# Patient Record
Sex: Male | Born: 1985
Health system: Southern US, Community
[De-identification: ages and names within clinical notes are randomized; demographics above are authoritative.]

## PROBLEM LIST (undated history)

## (undated) DIAGNOSIS — C801 Malignant (primary) neoplasm, unspecified: Secondary | ICD-10-CM

## (undated) HISTORY — PX: KNEE ARTHROSCOPY: SUR90

---
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
## 2015-05-22 ENCOUNTER — Ambulatory Visit
Admission: RE | Admit: 2015-05-22 | Discharge: 2015-05-22 | Disposition: A | Discharge status: Home / Self Care | Payer: No Typology Code available for payment source | Source: Ambulatory Visit | Attending: Occupational Medicine | Admitting: Occupational Medicine

## 2015-05-22 ENCOUNTER — Other Ambulatory Visit: Payer: Self-pay | Admitting: Occupational Medicine

## 2015-05-22 DIAGNOSIS — Z021 Encounter for pre-employment examination: Secondary | ICD-10-CM

## 2015-05-29 ENCOUNTER — Other Ambulatory Visit: Payer: Self-pay | Admitting: Occupational Medicine

## 2016-05-18 ENCOUNTER — Ambulatory Visit (HOSPITAL_COMMUNITY)
Admission: EM | Admit: 2016-05-18 | Discharge: 2016-05-18 | Disposition: A | Discharge status: Home / Self Care | Payer: Commercial Managed Care - HMO | Attending: Family Medicine | Admitting: Family Medicine

## 2016-05-18 ENCOUNTER — Encounter (HOSPITAL_COMMUNITY): Payer: Self-pay | Admitting: Emergency Medicine

## 2016-05-18 DIAGNOSIS — R591 Generalized enlarged lymph nodes: Secondary | ICD-10-CM | POA: Diagnosis not present

## 2016-05-18 DIAGNOSIS — R599 Enlarged lymph nodes, unspecified: Secondary | ICD-10-CM

## 2016-05-18 DIAGNOSIS — Z9889 Other specified postprocedural states: Secondary | ICD-10-CM | POA: Insufficient documentation

## 2016-05-18 LAB — COMPREHENSIVE METABOLIC PANEL
ALK PHOS: 71 U/L (ref 38–126)
ALT: 16 U/L — ABNORMAL LOW (ref 17–63)
ANION GAP: 4 — AB (ref 5–15)
AST: 16 U/L (ref 15–41)
Albumin: 3.2 g/dL — ABNORMAL LOW (ref 3.5–5.0)
BILIRUBIN TOTAL: 0.2 mg/dL — AB (ref 0.3–1.2)
BUN: 14 mg/dL (ref 6–20)
CALCIUM: 8.9 mg/dL (ref 8.9–10.3)
CO2: 26 mmol/L (ref 22–32)
Chloride: 107 mmol/L (ref 101–111)
Creatinine, Ser: 1.04 mg/dL (ref 0.61–1.24)
GFR calc Af Amer: >60 mL/min (ref >60)
GLUCOSE: 89 mg/dL (ref 65–99)
Potassium: 4.1 mmol/L (ref 3.5–5.1)
Sodium: 137 mmol/L (ref 135–145)
TOTAL PROTEIN: 7.6 g/dL (ref 6.5–8.1)

## 2016-05-18 LAB — CBC WITH DIFFERENTIAL/PLATELET
Basophils Absolute: 0 10*3/uL (ref 0.0–0.1)
Basophils Relative: 0 %
EOS PCT: 8 %
Eosinophils Absolute: 1 10*3/uL — ABNORMAL HIGH (ref 0.0–0.7)
HCT: 36.3 % — ABNORMAL LOW (ref 39.0–52.0)
Hemoglobin: 11.5 g/dL — ABNORMAL LOW (ref 13.0–17.0)
LYMPHS ABS: 1.5 10*3/uL (ref 0.7–4.0)
LYMPHS PCT: 13 %
MCH: 25.4 pg — AB (ref 26.0–34.0)
MCHC: 31.7 g/dL (ref 30.0–36.0)
MCV: 80.3 fL (ref 78.0–100.0)
MONO ABS: 0.6 10*3/uL (ref 0.1–1.0)
MONOS PCT: 5 %
Neutro Abs: 8.6 10*3/uL — ABNORMAL HIGH (ref 1.7–7.7)
Neutrophils Relative %: 74 %
PLATELETS: 279 10*3/uL (ref 150–400)
RBC: 4.52 MIL/uL (ref 4.22–5.81)
RDW: 15.8 % — AB (ref 11.5–15.5)
WBC: 11.7 10*3/uL — ABNORMAL HIGH (ref 4.0–10.5)

## 2016-05-18 LAB — LACTATE DEHYDROGENASE: LDH: 156 U/L (ref 98–192)

## 2016-05-18 LAB — SEDIMENTATION RATE: Sed Rate: 40 mm/hr — ABNORMAL HIGH (ref 0–16)

## 2016-05-18 NOTE — Discharge Instructions (Signed)

## 2016-05-18 NOTE — ED Provider Notes (Signed)
CSN: [id number]     Arrival date & time 05/18/16  1001 History   First MD Initiated Contact with Patient 05/18/16 1144     Chief Complaint  Patient presents with  . Lymphadenopathy   (Consider location/radiation/quality/duration/timing/severity/associated sxs/prior Treatment) HPI History obtained from patient:  Pt presents with the cc of:  Swollen nodes in neck2 Duration of symptoms: Neck nodes have been present since November left axillary nodes over the past month and groin nodes just recently noticed. Treatment prior to arrival: No treatment Context: Patient states that [place] [employer] when he noticed nodes in his left neck initially small and then have increase in size over the past few months and has not multiplied in the number of them. He denies any weight loss and fatigue any night sweats. He states that he has not had any problems doing his job. Other symptoms include: None Pain score: None FAMILY HISTORY: Hypertension    History reviewed. No pertinent past medical history. Past Surgical History  Procedure Laterality Date  . Knee arthroscopy     No family history on file. Social History  Substance Use Topics  . Smoking status: Never Smoker   . Smokeless tobacco: None  . Alcohol Use: No    Review of Systems  Denies: HEADACHE, NAUSEA, ABDOMINAL PAIN, CHEST PAIN, CONGESTION, DYSURIA, SHORTNESS OF BREATH  Allergies  Review of patient's allergies indicates no known allergies.  Home Medications   Prior to Admission medications   Not on File   Meds Ordered and Administered this Visit  Medications - No data to display  BP 124/68 mmHg  Pulse 55  Temp(Src) 98.1 F (36.7 C) (Oral)  Resp 16  SpO2 98% No data found.   Physical Exam NURSES NOTES AND VITAL SIGNS REVIEWED. CONSTITUTIONAL: Well developed, well nourished, no acute distress HEENT: normocephalic, atraumatic EYES: Conjunctiva normal NECK:normal ROM, supple, nodes noted in the cervical chain  left side 4-5 nodes 1 cm fixed not mobile.  PULMONARY:No respiratory distress, normal effort ABDOMINAL: Soft, ND, NT BS+, No CVAT MUSCULOSKELETAL: Normal ROM of all extremities, single node left axilla and several pea size nodes in left groin.  SKIN: warm and dry without rash PSYCHIATRIC: Mood and affect, behavior are normal  ED Course  Procedures (including critical care time)  Labs Review Labs Reviewed  CBC WITH DIFFERENTIAL/PLATELET - Abnormal; Notable for the following:    WBC 11.7 (*)    Hemoglobin 11.5 (*)    HCT 36.3 (*)    MCH 25.4 (*)    RDW 15.8 (*)    Neutro Abs 8.6 (*)    Eosinophils Absolute 1.0 (*)    All other components within normal limits  SEDIMENTATION RATE - Abnormal; Notable for the following:    Sed Rate 40 (*)    All other components within normal limits  COMPREHENSIVE METABOLIC PANEL - Abnormal; Notable for the following:    Albumin 3.2 (*)    ALT 16 (*)    Total Bilirubin 0.2 (*)    Anion gap 4 (*)    All other components within normal limits  LACTATE DEHYDROGENASE    Imaging Review No results found.   Visual Acuity Review  Right Eye Distance:   Left Eye Distance:   Bilateral Distance:    Right Eye Near:   Left Eye Near:    Bilateral Near:      Patient is referred to Dr. Barkley Bruns for consideration of biopsy of these lymph nodes. Discussed case with Dr. Benay Spice  from oncology he also suggested obtaining a CBC, sedimentation rate, LDH, total chemistry. He is happy to see the patient after surgical consultation has been done. Appointment for patient has been set up and surgical service will see him tomorrow in the office.  Total Visit Time:45 MINUTES "GREATER THAN 50% WAS SPENT IN COUNSELING AND COORDINATION OF CARE WITH THE PATIENT"    MDM   1. Enlarged lymph nodes, unspecified     Patient is reassured that there are no issues that require transfer to higher level of care at this time or additional tests. Patient is advised to  continue home symptomatic treatment. Patient is advised that if there are new or worsening symptoms to attend the emergency department, contact primary care provider, or return to UC. Instructions of care provided discharged home in stable condition.    THIS NOTE WAS GENERATED USING A VOICE RECOGNITION SOFTWARE PROGRAM. ALL REASONABLE EFFORTS  WERE MADE TO PROOFREAD THIS DOCUMENT FOR ACCURACY.  I have verbally reviewed the discharge instructions with the patient. A printed AVS was given to the patient.  All questions were answered prior to discharge.      Craig Schmitt, [place] 05/18/16 1729

## 2016-05-18 NOTE — ED Notes (Signed)
PT has a swollen area over left side of neck that has been present since November. Area is not tender. PT also has a swollen area under his left arm that he reports has been there for 2 weeks. This one is intermittently painful. PT reports since it appeared he has had swelling in his left arm.

## 2016-05-18 NOTE — ED Notes (Signed)
PT discharged by Linde Gillis, PA

## 2016-05-19 ENCOUNTER — Other Ambulatory Visit: Payer: Self-pay | Admitting: General Surgery

## 2016-05-19 ENCOUNTER — Ambulatory Visit: Payer: Self-pay | Admitting: General Surgery

## 2016-05-19 DIAGNOSIS — R590 Localized enlarged lymph nodes: Secondary | ICD-10-CM

## 2016-05-19 NOTE — H&P (Signed)
Craig Schmitt 05/19/2016 9:23 AM Location: [facility] Patient #: [id number] DOB: May 12, 1986 Single / Language: Craig Schmitt / Race: Black or African American Male  History of Present Illness Craig Hollingshead MD; 05/19/2016 10:19 AM) The patient is a 30 year old male.   Note:He is referred by Linde Gillis, PA because of cervical and axillary lymphadenopathy. He first noticed enlargement of left cervical lymph nodes November 2016. Lymph nodes have been greater than progressively getting larger. He then noticed an enlarged lymph node in his left axillary area. These are mildly tender and he's been taking ibuprofen for this. No weight loss. No night sweats. No family history of lymphoma or other malignancy and he is aware of. No dysphasia. No change in appetite. He presented to urgent care for evaluation and was sent over here for consultation. His CBC there was notable for a hemoglobin of 11.5, white cell count 11,700, sedimentation rate of 40, normal LDH, and albumin of 3.2. 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  Other Problems Craig Schmitt, CMA; 05/19/2016 9:24 AM) No pertinent past medical history  Past Surgical History Craig Schmitt, CMA; 05/19/2016 9:24 AM) No pertinent past surgical history  Diagnostic Studies History Craig Schmitt, CMA; 05/19/2016 9:24 AM) Colonoscopy never  Allergies (Craig Schmitt, CMA; 05/19/2016 9:25 AM) No Known Drug Allergies 05/19/2016  Medication History (Craig Schmitt, CMA; 05/19/2016 9:25 AM) No Current Medications Medications Reconciled  Social History Craig Schmitt, CMA; 05/19/2016 9:24 AM) Alcohol use Remotely quit alcohol use. No caffeine use No drug use Tobacco use Never smoker.  Family History Craig Schmitt, CMA; 05/19/2016 9:24 AM) Hypertension Brother, Sister.     Review of Systems Craig Schmitt CMA; 05/19/2016 9:24 AM) General Not Present- Appetite Loss, Chills, Fatigue, Fever, Night Sweats, Weight Gain and  Weight Loss. Skin Not Present- Change in Wart/Mole, Dryness, Hives, Jaundice, New Lesions, Non-Healing Wounds, Rash and Ulcer. HEENT Present- Visual Disturbances. Not Present- Earache, Hearing Loss, Hoarseness, Nose Bleed, Oral Ulcers, Ringing in the Ears, Seasonal Allergies, Sinus Pain, Sore Throat, Wears glasses/contact lenses and Yellow Eyes. Respiratory Not Present- Bloody sputum, Chronic Cough, Difficulty Breathing, Snoring and Wheezing. Cardiovascular Not Present- Chest Pain, Difficulty Breathing Lying Down, Leg Cramps, Palpitations, Rapid Heart Rate, Shortness of Breath and Swelling of Extremities. Gastrointestinal Not Present- Abdominal Pain, Bloating, Bloody Stool, Change in Bowel Habits, Chronic diarrhea, Constipation, Difficulty Swallowing, Excessive gas, Gets full quickly at meals, Hemorrhoids, Indigestion, Nausea, Rectal Pain and Vomiting. Male Genitourinary Not Present- Blood in Urine, Change in Urinary Stream, Frequency, Impotence, Nocturia, Painful Urination, Urgency and Urine Leakage. Musculoskeletal Present- Swelling of Extremities. Not Present- Back Pain, Joint Pain, Joint Stiffness, Muscle Pain and Muscle Weakness. Neurological Not Present- Decreased Memory, Fainting, Headaches, Numbness, Seizures, Tingling, Tremor, Trouble walking and Weakness. Psychiatric Not Present- Anxiety, Bipolar, Change in Sleep Pattern, Depression, Fearful and Frequent crying. Endocrine Not Present- Cold Intolerance, Excessive Hunger, Hair Changes, Heat Intolerance, Hot flashes and New Diabetes. Hematology Not Present- Blood Thinners, Easy Bruising, Excessive bleeding, Gland problems, HIV and Persistent Infections.  Vitals (Craig Schmitt CMA; 05/19/2016 9:24 AM) 05/19/2016 9:24 AM Weight: 268 lb Height: 73in Body Surface Area: 2.44 m Body Mass Index: 35.36 kg/m  Temp.: 53F(Temporal)  Pulse: 77 (Regular)  BP: 128/80 (Sitting, Left Arm, Standard)      Physical Exam Craig Hollingshead  MD; 05/19/2016 10:20 AM)  The physical exam findings are as follows: Note:General: Muscular male in NAD. Pleasant and cooperative.  HEENT: [place]/AT, no external nasal or ear masses, mucous membranes  are moist  EYES: EOMI, no scleral icterus, pupils normal  NECK: Supple, no obvious thyroid mass/enlargement, no trachea deviation  CV: RRR, no murmur, no edema  CHEST: Breath sounds equal and clear. Respirations nonlabored.  ABDOMEN: Soft, nontender, nondistended, no masses, no organomegaly, active bowel sounds, no scars, no hernias.  MUSCULOSKELETAL: FROM, good muscle tone, no edema, no venous stasis changes, normal station and gait  LYMPHATIC: palpable left posterior triangle cervical LNs that are fixed, left , fixed axillary adenopathy.  NEUROLOGIC: Alert and oriented, answers questions appropriately, normal gait and station.  PSYCHIATRIC: Normal mood, affect , and behavior.    Assessment & Plan Craig Hollingshead MD; 05/19/2016 10:16 AM)  Craig Schmitt, CERVICAL (R59.0) Impression: Nodes are firm and fixed in the posterior triangle of the left side. This is concerning for neoplastic process. No weight loss, decreased energy level, or night sweats.  Plan: CT of neck, chest, abdomen, pelvis. Left cervical lymph node biopsy. The procedure, risks, rationale were explained to him. Risks include but not limited to bleeding, infection, wound healing problem, anesthesia, nerve injury. He seems to understand all of this and agrees with the plan.  Craig Confer, MD

## 2016-05-24 ENCOUNTER — Ambulatory Visit
Admission: RE | Admit: 2016-05-24 | Discharge: 2016-05-24 | Disposition: A | Discharge status: Home / Self Care | Payer: Commercial Managed Care - HMO | Source: Ambulatory Visit | Attending: General Surgery | Admitting: General Surgery

## 2016-05-24 ENCOUNTER — Other Ambulatory Visit: Payer: Commercial Managed Care - HMO

## 2016-05-24 DIAGNOSIS — R590 Localized enlarged lymph nodes: Secondary | ICD-10-CM

## 2016-05-24 MED ORDER — IOPAMIDOL (ISOVUE-300) INJECTION 61%
100.0000 mL | Freq: Once | INTRAVENOUS | Status: AC | PRN
  Administered 2016-05-24: 100 mL via INTRAVENOUS

## 2016-05-24 MED ORDER — IOPAMIDOL (ISOVUE-300) INJECTION 61%: 50.0000 mL | Freq: Once | INTRAVENOUS | Status: DC | PRN

## 2016-06-09 ENCOUNTER — Ambulatory Visit: Payer: Self-pay | Admitting: General Surgery

## 2016-07-14 ENCOUNTER — Encounter (HOSPITAL_BASED_OUTPATIENT_CLINIC_OR_DEPARTMENT_OTHER): Payer: Self-pay | Admitting: *Deleted

## 2016-07-20 ENCOUNTER — Ambulatory Visit (HOSPITAL_BASED_OUTPATIENT_CLINIC_OR_DEPARTMENT_OTHER): Payer: Commercial Managed Care - HMO | Admitting: Anesthesiology

## 2016-07-20 ENCOUNTER — Encounter (HOSPITAL_BASED_OUTPATIENT_CLINIC_OR_DEPARTMENT_OTHER)
Admission: RE | Disposition: A | Discharge status: Home / Self Care | Payer: Self-pay | Source: Ambulatory Visit | Attending: General Surgery

## 2016-07-20 ENCOUNTER — Ambulatory Visit (HOSPITAL_BASED_OUTPATIENT_CLINIC_OR_DEPARTMENT_OTHER)
Admission: RE | Admit: 2016-07-20 | Discharge: 2016-07-20 | Disposition: A | Discharge status: Home / Self Care | Payer: Commercial Managed Care - HMO | Source: Ambulatory Visit | Attending: General Surgery | Admitting: General Surgery

## 2016-07-20 DIAGNOSIS — C8191 Hodgkin lymphoma, unspecified, lymph nodes of head, face, and neck: Secondary | ICD-10-CM | POA: Diagnosis not present

## 2016-07-20 DIAGNOSIS — R590 Localized enlarged lymph nodes: Secondary | ICD-10-CM | POA: Diagnosis present

## 2016-07-20 HISTORY — PX: LYMPH GLAND EXCISION: SHX13

## 2016-07-20 SURGERY — EXCISION, LYMPH NODE, CERVICAL
Anesthesia: General | Site: Neck | Laterality: Left

## 2016-07-20 MED ORDER — BUPIVACAINE-EPINEPHRINE 0.5% -1:200000 IJ SOLN
INTRAMUSCULAR | Status: DC | PRN
  Administered 2016-07-20: 3 mL

## 2016-07-20 MED ORDER — CHLORHEXIDINE GLUCONATE CLOTH 2 % EX PADS: 6.0000 | MEDICATED_PAD | Freq: Once | CUTANEOUS | Status: DC

## 2016-07-20 MED ORDER — CEFAZOLIN SODIUM-DEXTROSE 2-4 GM/100ML-% IV SOLN
INTRAVENOUS | Status: AC
  Filled 2016-07-20: qty 100

## 2016-07-20 MED ORDER — CEFAZOLIN SODIUM-DEXTROSE 2-3 GM-% IV SOLR
INTRAVENOUS | Status: DC | PRN
  Administered 2016-07-20: 2 g via INTRAVENOUS

## 2016-07-20 MED ORDER — SUCCINYLCHOLINE CHLORIDE 200 MG/10ML IV SOSY
PREFILLED_SYRINGE | INTRAVENOUS | Status: AC
  Filled 2016-07-20: qty 10

## 2016-07-20 MED ORDER — ONDANSETRON HCL 4 MG/2ML IJ SOLN
INTRAMUSCULAR | Status: DC | PRN
  Administered 2016-07-20: 4 mg via INTRAVENOUS

## 2016-07-20 MED ORDER — PROPOFOL 500 MG/50ML IV EMUL
INTRAVENOUS | Status: AC
  Filled 2016-07-20: qty 50

## 2016-07-20 MED ORDER — BUPIVACAINE HCL (PF) 0.5 % IJ SOLN
INTRAMUSCULAR | Status: AC
  Filled 2016-07-20: qty 30

## 2016-07-20 MED ORDER — MEPERIDINE HCL 25 MG/ML IJ SOLN: 6.2500 mg | INTRAMUSCULAR | Status: DC | PRN

## 2016-07-20 MED ORDER — PROPOFOL 10 MG/ML IV BOLUS
INTRAVENOUS | Status: DC | PRN
  Administered 2016-07-20: 200 mg via INTRAVENOUS

## 2016-07-20 MED ORDER — GLYCOPYRROLATE 0.2 MG/ML IJ SOLN: 0.2000 mg | Freq: Once | INTRAMUSCULAR | Status: DC | PRN

## 2016-07-20 MED ORDER — HYDROMORPHONE HCL 1 MG/ML IJ SOLN: 0.2500 mg | INTRAMUSCULAR | Status: DC | PRN

## 2016-07-20 MED ORDER — FENTANYL CITRATE (PF) 100 MCG/2ML IJ SOLN
INTRAMUSCULAR | Status: DC | PRN
  Administered 2016-07-20: 100 ug via INTRAVENOUS
  Administered 2016-07-20: 50 ug via INTRAVENOUS

## 2016-07-20 MED ORDER — CEFAZOLIN SODIUM-DEXTROSE 2-4 GM/100ML-% IV SOLN: 2.0000 g | INTRAVENOUS | Status: DC

## 2016-07-20 MED ORDER — LIDOCAINE-EPINEPHRINE 1 %-1:100000 IJ SOLN
INTRAMUSCULAR | Status: AC
  Filled 2016-07-20: qty 1

## 2016-07-20 MED ORDER — HYDROCODONE-ACETAMINOPHEN 5-325 MG PO TABS: 1.0000 | ORAL_TABLET | ORAL | 0 refills | Status: DC | PRN

## 2016-07-20 MED ORDER — FENTANYL CITRATE (PF) 100 MCG/2ML IJ SOLN
INTRAMUSCULAR | Status: AC
  Filled 2016-07-20: qty 2

## 2016-07-20 MED ORDER — PROMETHAZINE HCL 25 MG/ML IJ SOLN: 6.2500 mg | INTRAMUSCULAR | Status: DC | PRN

## 2016-07-20 MED ORDER — ONDANSETRON HCL 4 MG/2ML IJ SOLN
INTRAMUSCULAR | Status: AC
  Filled 2016-07-20: qty 2

## 2016-07-20 MED ORDER — SCOPOLAMINE 1 MG/3DAYS TD PT72: 1.0000 | MEDICATED_PATCH | Freq: Once | TRANSDERMAL | Status: DC | PRN

## 2016-07-20 MED ORDER — DEXAMETHASONE SODIUM PHOSPHATE 10 MG/ML IJ SOLN
INTRAMUSCULAR | Status: AC
  Filled 2016-07-20: qty 1

## 2016-07-20 MED ORDER — LIDOCAINE 2% (20 MG/ML) 5 ML SYRINGE
INTRAMUSCULAR | Status: AC
  Filled 2016-07-20: qty 5

## 2016-07-20 MED ORDER — MIDAZOLAM HCL 5 MG/5ML IJ SOLN
INTRAMUSCULAR | Status: DC | PRN
  Administered 2016-07-20: 2 mg via INTRAVENOUS

## 2016-07-20 MED ORDER — LACTATED RINGERS IV SOLN
INTRAVENOUS | Status: DC
  Administered 2016-07-20 (×2): via INTRAVENOUS

## 2016-07-20 MED ORDER — LIDOCAINE HCL (CARDIAC) 20 MG/ML IV SOLN
INTRAVENOUS | Status: DC | PRN
  Administered 2016-07-20: 30 mg via INTRAVENOUS

## 2016-07-20 MED ORDER — LACTATED RINGERS IV SOLN: INTRAVENOUS | Status: DC

## 2016-07-20 MED ORDER — DEXAMETHASONE SODIUM PHOSPHATE 4 MG/ML IJ SOLN
INTRAMUSCULAR | Status: DC | PRN
  Administered 2016-07-20: 10 mg via INTRAVENOUS

## 2016-07-20 MED ORDER — OXYCODONE HCL 5 MG PO TABS: 5.0000 mg | ORAL_TABLET | Freq: Once | ORAL | Status: DC | PRN

## 2016-07-20 MED ORDER — BUPIVACAINE-EPINEPHRINE (PF) 0.5% -1:200000 IJ SOLN
INTRAMUSCULAR | Status: AC
  Filled 2016-07-20: qty 30

## 2016-07-20 MED ORDER — MIDAZOLAM HCL 2 MG/2ML IJ SOLN: 1.0000 mg | INTRAMUSCULAR | Status: DC | PRN

## 2016-07-20 MED ORDER — SODIUM BICARBONATE 4 % IV SOLN
INTRAVENOUS | Status: AC
  Filled 2016-07-20: qty 5

## 2016-07-20 MED ORDER — OXYCODONE HCL 5 MG/5ML PO SOLN: 5.0000 mg | Freq: Once | ORAL | Status: DC | PRN

## 2016-07-20 MED ORDER — MIDAZOLAM HCL 2 MG/2ML IJ SOLN
INTRAMUSCULAR | Status: AC
  Filled 2016-07-20: qty 2

## 2016-07-20 MED ORDER — FENTANYL CITRATE (PF) 100 MCG/2ML IJ SOLN: 50.0000 ug | INTRAMUSCULAR | Status: DC | PRN

## 2016-07-20 SURGICAL SUPPLY — 48 items
BENZOIN TINCTURE PRP APPL 2/3 (GAUZE/BANDAGES/DRESSINGS) ×2 IMPLANT
BLADE CLIPPER SURG (BLADE) IMPLANT
BLADE SURG 10 STRL SS (BLADE) ×2 IMPLANT
BLADE SURG 15 STRL LF DISP TIS (BLADE) ×1 IMPLANT
BLADE SURG 15 STRL SS (BLADE) ×1
CANISTER SUCT 1200ML W/VALVE (MISCELLANEOUS) IMPLANT
CHLORAPREP W/TINT 26ML (MISCELLANEOUS) ×2 IMPLANT
CLEANER CAUTERY TIP 5X5 PAD (MISCELLANEOUS) IMPLANT
COVER BACK TABLE 60X90IN (DRAPES) ×2 IMPLANT
COVER MAYO STAND STRL (DRAPES) ×2 IMPLANT
DECANTER SPIKE VIAL GLASS SM (MISCELLANEOUS) IMPLANT
DRAPE LAPAROTOMY 100X72 PEDS (DRAPES) ×2 IMPLANT
DRSG TEGADERM 2-3/8X2-3/4 SM (GAUZE/BANDAGES/DRESSINGS) ×2 IMPLANT
DRSG TEGADERM 4X4.75 (GAUZE/BANDAGES/DRESSINGS) IMPLANT
ELECT REM PT RETURN 9FT ADLT (ELECTROSURGICAL) ×2
ELECTRODE REM PT RTRN 9FT ADLT (ELECTROSURGICAL) ×1 IMPLANT
GAUZE SPONGE 4X4 16PLY XRAY LF (GAUZE/BANDAGES/DRESSINGS) IMPLANT
GLOVE BIOGEL PI IND STRL 7.0 (GLOVE) ×2 IMPLANT
GLOVE BIOGEL PI IND STRL 8.5 (GLOVE) ×1 IMPLANT
GLOVE BIOGEL PI INDICATOR 7.0 (GLOVE) ×2
GLOVE BIOGEL PI INDICATOR 8.5 (GLOVE) ×1
GLOVE ECLIPSE 6.5 STRL STRAW (GLOVE) ×2 IMPLANT
GLOVE ECLIPSE 8.0 STRL XLNG CF (GLOVE) ×2 IMPLANT
GOWN STRL REUS W/ TWL LRG LVL3 (GOWN DISPOSABLE) ×2 IMPLANT
GOWN STRL REUS W/TWL LRG LVL3 (GOWN DISPOSABLE) ×2
HEMOSTAT SURGICEL .5X2 ABSORB (HEMOSTASIS) ×2 IMPLANT
LIQUID BAND (GAUZE/BANDAGES/DRESSINGS) ×2 IMPLANT
NEEDLE HYPO 25X1 1.5 SAFETY (NEEDLE) ×2 IMPLANT
NS IRRIG 1000ML POUR BTL (IV SOLUTION) ×2 IMPLANT
PACK BASIN DAY SURGERY FS (CUSTOM PROCEDURE TRAY) ×2 IMPLANT
PAD CLEANER CAUTERY TIP 5X5 (MISCELLANEOUS)
PENCIL BUTTON HOLSTER BLD 10FT (ELECTRODE) ×2 IMPLANT
SPONGE GAUZE 2X2 8PLY STRL LF (GAUZE/BANDAGES/DRESSINGS) ×2 IMPLANT
SPONGE GAUZE 4X4 12PLY STER LF (GAUZE/BANDAGES/DRESSINGS) IMPLANT
STRIP CLOSURE SKIN 1/2X4 (GAUZE/BANDAGES/DRESSINGS) ×2 IMPLANT
SUT MON AB 4-0 PC3 18 (SUTURE) ×2 IMPLANT
SUT PROLENE 2 0 CT2 30 (SUTURE) IMPLANT
SUT VIC AB 2-0 SH 27 (SUTURE) ×1
SUT VIC AB 2-0 SH 27XBRD (SUTURE) ×1 IMPLANT
SUT VIC AB 3-0 SH 27 (SUTURE) ×1
SUT VIC AB 3-0 SH 27X BRD (SUTURE) ×1 IMPLANT
SUT VIC AB 4-0 SH 18 (SUTURE) IMPLANT
SUT VIC AB 4-0 SH 27 (SUTURE)
SUT VIC AB 4-0 SH 27XANBCTRL (SUTURE) IMPLANT
SYR CONTROL 10ML LL (SYRINGE) ×2 IMPLANT
TOWEL OR 17X24 6PK STRL BLUE (TOWEL DISPOSABLE) ×4 IMPLANT
TUBE CONNECTING 20X1/4 (TUBING) IMPLANT
YANKAUER SUCT BULB TIP NO VENT (SUCTIONS) IMPLANT

## 2016-07-20 NOTE — Anesthesia Procedure Notes (Signed)
Procedure Name: LMA Insertion Date/Time: 07/20/2016 7:31 AM Performed by: Toula Moos L Pre-anesthesia Checklist: Patient identified, Emergency Drugs available, Suction available, Patient being monitored and Timeout performed Patient Re-evaluated:Patient Re-evaluated prior to inductionOxygen Delivery Method: Circle system utilized Preoxygenation: Pre-oxygenation with 100% oxygen Intubation Type: IV induction Ventilation: Mask ventilation without difficulty LMA: LMA inserted LMA Size: 5.0 Number of attempts: 1 Airway Equipment and Method: Bite block Placement Confirmation: positive ETCO2 Tube secured with: Tape Dental Injury: Teeth and Oropharynx as per pre-operative assessment

## 2016-07-20 NOTE — Anesthesia Postprocedure Evaluation (Signed)
Anesthesia Post Note  Patient: Craig Schmitt  Procedure(s) Performed: Procedure(s) (LRB): LEFT CERVICAL LYMPH NODE BIOPSY (Left)  Patient location during evaluation: PACU Anesthesia Type: General Level of consciousness: awake and alert Pain management: pain level controlled Vital Signs Assessment: post-procedure vital signs reviewed and stable Respiratory status: spontaneous breathing, nonlabored ventilation, respiratory function stable and patient connected to nasal cannula oxygen Cardiovascular status: blood pressure returned to baseline and stable Postop Assessment: no signs of nausea or vomiting Anesthetic complications: no    Last Vitals:  Vitals:   07/20/16 0900 07/20/16 0905  BP: (!) 151/90   Pulse: 69 63  Resp: 18 18  Temp:      Last Pain:  Vitals:   07/20/16 0845  TempSrc:   PainSc: 0-No pain                 Effie Berkshire

## 2016-07-20 NOTE — Discharge Instructions (Addendum)
Light activities for 3-5 days.  Stay upright for next 6 hours.  Apply ice to the area for the next 2-3 days to help with swelling and pain.  Take pain medicine as needed.  Call for heavy bleeding or other wound problems.    Post Anesthesia Home Care Instructions  Activity: Get plenty of rest for the remainder of the day. A responsible adult should stay with you for 24 hours following the procedure.  For the next 24 hours, DO NOT: -Drive a car -Paediatric nurse -Drink alcoholic beverages -Take any medication unless instructed by your physician -Make any legal decisions or sign important papers.  Meals: Start with liquid foods such as gelatin or soup. Progress to regular foods as tolerated. Avoid greasy, spicy, heavy foods. If nausea and/or vomiting occur, drink only clear liquids until the nausea and/or vomiting subsides. Call your physician if vomiting continues.  Special Instructions/Symptoms: Your throat may feel dry or sore from the anesthesia or the breathing tube placed in your throat during surgery. If this causes discomfort, gargle with warm salt water. The discomfort should disappear within 24 hours.  If you had a scopolamine patch placed behind your ear for the management of post- operative nausea and/or vomiting:  1. The medication in the patch is effective for 72 hours, after which it should be removed.  Wrap patch in a tissue and discard in the trash. Wash hands thoroughly with soap and water. 2. You may remove the patch earlier than 72 hours if you experience unpleasant side effects which may include dry mouth, dizziness or visual disturbances. 3. Avoid touching the patch. Wash your hands with soap and water after contact with the patch.

## 2016-07-20 NOTE — Op Note (Signed)
Operative Note  Craig Schmitt male 30 y.o. 07/20/2016  PREOPERATIVE DX:  Left cervical lymphadenopathy  POSTOPERATIVE DX:  Same  PROCEDURE:   Left cervical lymph node biopsy         Surgeon: Odis Hollingshead   Assistants: None  Anesthesia: General LMA anesthesia + Marcaine local  Indications:   This is a 30 year old otherwise healthy male which had progressively increasing left cervical and left axillary adenopathy. CT scan demonstrates a cervical maxillary adenopathy as well as mediastinal adenopathy. He now presents for left cervical lymph node biopsy.    Procedure Detail:  He was seen in the holding area in the left neck marked with my initials. He is brought to the operating room placed supine on the operating table in the anesthetic was administered. The left neck was sterilely prepped and draped. A timeout was performed.  Local anesthetic was infiltrated in the left lateral leg directly over a very large, hard lymph node. A small transverse incision was made through the skin and subcutaneous tissue as well as superficial muscle. The lymph node was exposed. Using electrocautery and the scalpel I removed multiple pieces of the lymph node. Bleeding was controlled with electrocautery and a small piece of Surgicel. The biopsy specimens were sent to pathology.  Once hemostasis was adequate, the muscles reapproximated with interrupted 3-0 Vicryl sutures. The subcutaneous tissues approximated with interrupted 3-0 Vicryl sutures. The skin was closed with a running 4-0 Monocryl subcuticular stitch. Steri-Strips and a sterile dressing were applied.  He tolerated the procedure well without any apparent complications. He was taken to the recovery room in satisfactory condition.  Estimated Blood Loss:  less than 100 mL         Specimens: Multiple pieces of an enlarged left cervical lymph node.        Complications:  * No complications entered in OR log *         Disposition: PACU -  hemodynamically stable.         Condition: stable

## 2016-07-20 NOTE — Transfer of Care (Signed)
Immediate Anesthesia Transfer of Care Note  Patient: Craig Schmitt  Procedure(s) Performed: Procedure(s): LEFT CERVICAL LYMPH NODE BIOPSY (Left)  Patient Location: PACU  Anesthesia Type:General  Level of Consciousness: awake  Airway & Oxygen Therapy: Patient Spontanous Breathing and Patient connected to face mask oxygen  Post-op Assessment: Report given to RN and Post -op Vital signs reviewed and stable  Post vital signs: Reviewed and stable  Last Vitals:  Vitals:   07/20/16 0643  BP: 127/63  Pulse: (!) 55  Resp: 16  Temp: 36.8 C    Last Pain:  Vitals:   07/20/16 0643  TempSrc: Oral         Complications: No apparent anesthesia complications

## 2016-07-20 NOTE — Interval H&P Note (Signed)
History and Physical Interval Note:  07/20/2016 7:22 AM  Craig Schmitt  has presented today for surgery, with the diagnosis of lymphadenopathy  The various methods of treatment have been discussed with the patient and family. After consideration of risks, benefits and other options for treatment, the patient has consented to  Procedure(s): LEFT CERVICAL LYMPH NODE BIOPSY (Left) as a surgical intervention .  The patient's history has been reviewed, patient examined, no change in status, stable for surgery.  I have reviewed the patient's chart and labs.  Questions were answered to the patient's satisfaction.     [name]

## 2016-07-20 NOTE — Anesthesia Preprocedure Evaluation (Addendum)
Anesthesia Evaluation  Patient identified by MRN, date of birth, ID band Patient awake    Reviewed: Allergy & Precautions, NPO status , Patient's Chart, lab work & pertinent test results  Airway Mallampati: I  TM Distance: >3 FB Neck ROM: Full    Dental  (+) Teeth Intact, Dental Advisory Given   Pulmonary neg pulmonary ROS,    breath sounds clear to auscultation       Cardiovascular negative cardio ROS   Rhythm:Regular Rate:Normal     Neuro/Psych negative neurological ROS  negative psych ROS   GI/Hepatic negative GI ROS, Neg liver ROS,   Endo/Other  negative endocrine ROS  Renal/GU negative Renal ROS  negative genitourinary   Musculoskeletal negative musculoskeletal ROS (+)   Abdominal   Peds negative pediatric ROS (+)  Hematology negative hematology ROS (+)   Anesthesia Other Findings   Reproductive/Obstetrics negative OB ROS                            Anesthesia Physical Anesthesia Plan  ASA: I  Anesthesia Plan: General   Post-op Pain Management:    Induction: Intravenous  Airway Management Planned: LMA  Additional Equipment:   Intra-op Plan:   Post-operative Plan: Extubation in OR  Informed Consent: I have reviewed the patients History and Physical, chart, labs and discussed the procedure including the risks, benefits and alternatives for the proposed anesthesia with the patient or authorized representative who has indicated his/her understanding and acceptance.   Dental advisory given  Plan Discussed with: CRNA  Anesthesia Plan Comments:         Anesthesia Quick Evaluation

## 2016-07-20 NOTE — H&P (Signed)
History of Present Illness The patient is a 30 year old male.   Note:He was referred by Linde Gillis, PA because of cervical and axillary lymphadenopathy. He first noticed enlargement of left cervical lymph nodes November 2016. Lymph nodes have been  progressively getting larger. He then noticed an enlarged lymph node in his left axillary area. These are mildly tender and he's been taking ibuprofen for this. No weight loss. No night sweats. No family history of lymphoma or other malignancy and he is aware of. No dysphasia. No change in appetite. He presented to urgent care for evaluation and was sent over to [facility] for consultation. His CBC there was notable for a hemoglobin of 11.5, white cell count 11,700, sedimentation rate of 40, normal LDH, and albumin of 3.2. CTs showed mediastinal adenopathy, left cervical adenopathy, left axillary adenopathy.  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  Other Problems  No pertinent past medical history  Past Surgical History No pertinent past surgical history   Allergies (Sonya Bynum, CMA; 05/19/2016 9:25 AM) No Known Drug Allergies 05/19/2016  Prior to Admission medications   Medication Sig Start Date End Date Taking? Authorizing Provider  ibuprofen (ADVIL,MOTRIN) 200 MG tablet Take 200 mg by mouth every 6 (six) hours as needed for mild pain.   Yes Historical Provider, MD     Social History  Alcohol use Remotely quit alcohol use. No caffeine use No drug use Tobacco use Never smoker.  Family History  Hypertension Brother, Sister.   Physical Exam  The physical exam findings are as follows: Note:General: Muscular male in NAD. Pleasant and cooperative.  HEENT: [place]/AT, no external nasal or ear masses, mucous membranes are moist  EYES: EOMI, no scleral icterus, pupils normal  NECK: Supple, no obvious thyroid mass/enlargement, no trachea deviation  CV: RRR, no murmur, no edema  CHEST: Breath  sounds equal and clear. Respirations nonlabored.  ABDOMEN: Soft, nontender, nondistended, no masses, no organomegaly, active bowel sounds, no scars, no hernias.  MUSCULOSKELETAL: FROM, good muscle tone, no edema, no venous stasis changes, normal station and gait  LYMPHATIC: palpable left posterior triangle cervical LNs that are fixed, left , fixed axillary adenopathy.  NEUROLOGIC: Alert and oriented, answers questions appropriately, normal gait and station.  PSYCHIATRIC: Normal mood, affect , and behavior.    Assessment & Plan   LYMPHADENOPATHY, CERVICAL (R59.0) Impression: Nodes are firm and fixed in the posterior triangle of the left side. This is concerning for neoplastic process. No weight loss, decreased energy level, or night sweats.  Plan: . Left cervical lymph node biopsy. The procedure, risks, rationale were explained to him. Risks include but not limited to bleeding, infection, wound healing problem, anesthesia, nerve injury. He seems to understand all of this and agrees with the plan.  Jackolyn Confer, MD

## 2016-07-21 ENCOUNTER — Encounter (HOSPITAL_BASED_OUTPATIENT_CLINIC_OR_DEPARTMENT_OTHER): Payer: Self-pay | Admitting: General Surgery

## 2016-07-27 ENCOUNTER — Ambulatory Visit (HOSPITAL_BASED_OUTPATIENT_CLINIC_OR_DEPARTMENT_OTHER): Payer: Commercial Managed Care - HMO | Admitting: Hematology

## 2016-07-27 ENCOUNTER — Other Ambulatory Visit (HOSPITAL_BASED_OUTPATIENT_CLINIC_OR_DEPARTMENT_OTHER): Payer: Commercial Managed Care - HMO

## 2016-07-27 ENCOUNTER — Encounter: Payer: Self-pay | Admitting: Hematology

## 2016-07-27 VITALS — BP 145/75 | HR 61 | Temp 98.1°F | Resp 18 | Ht 73.0 in | Wt 264.1 lb

## 2016-07-27 DIAGNOSIS — Z23 Encounter for immunization: Secondary | ICD-10-CM

## 2016-07-27 DIAGNOSIS — C8118 Nodular sclerosis classical Hodgkin lymphoma, lymph nodes of multiple sites: Secondary | ICD-10-CM

## 2016-07-27 DIAGNOSIS — C8111 Nodular sclerosis classical Hodgkin lymphoma, lymph nodes of head, face, and neck: Secondary | ICD-10-CM

## 2016-07-27 DIAGNOSIS — C8190 Hodgkin lymphoma, unspecified, unspecified site: Secondary | ICD-10-CM | POA: Insufficient documentation

## 2016-07-27 LAB — CBC & DIFF AND RETIC
BASO%: 0.5 % (ref 0.0–2.0)
BASOS ABS: 0.1 10*3/uL (ref 0.0–0.1)
EOS%: 4.9 % (ref 0.0–7.0)
Eosinophils Absolute: 0.8 10*3/uL — ABNORMAL HIGH (ref 0.0–0.5)
HEMATOCRIT: 42.9 % (ref 38.4–49.9)
HGB: 13.8 g/dL (ref 13.0–17.1)
IMMATURE RETIC FRACT: 5.4 % (ref 3.00–10.60)
LYMPH#: 1.4 10*3/uL (ref 0.9–3.3)
LYMPH%: 9 % — ABNORMAL LOW (ref 14.0–49.0)
MCH: 26.2 pg — ABNORMAL LOW (ref 27.2–33.4)
MCHC: 32.3 g/dL (ref 32.0–36.0)
MCV: 81.2 fL (ref 79.3–98.0)
MONO#: 1 10*3/uL — AB (ref 0.1–0.9)
MONO%: 6.5 % (ref 0.0–14.0)
NEUT#: 12.3 10*3/uL — ABNORMAL HIGH (ref 1.5–6.5)
NEUT%: 79.1 % — AB (ref 39.0–75.0)
PLATELETS: 259 10*3/uL (ref 140–400)
RBC: 5.28 10*6/uL (ref 4.20–5.82)
RDW: 17.3 % — ABNORMAL HIGH (ref 11.0–14.6)
RETIC CT ABS: 42.77 10*3/uL (ref 34.80–93.90)
Retic %: 0.81 % (ref 0.80–1.80)
WBC: 15.5 10*3/uL — ABNORMAL HIGH (ref 4.0–10.3)

## 2016-07-27 LAB — COMPREHENSIVE METABOLIC PANEL
ALT: 18 U/L (ref 0–55)
ANION GAP: 11 meq/L (ref 3–11)
AST: 16 U/L (ref 5–34)
Albumin: 3.6 g/dL (ref 3.5–5.0)
Alkaline Phosphatase: 96 U/L (ref 40–150)
BILIRUBIN TOTAL: 0.35 mg/dL (ref 0.20–1.20)
BUN: 15.2 mg/dL (ref 7.0–26.0)
CO2: 24 meq/L (ref 22–29)
CREATININE: 1.1 mg/dL (ref 0.7–1.3)
Calcium: 10 mg/dL (ref 8.4–10.4)
Chloride: 106 mEq/L (ref 98–109)
EGFR: >90 mL/min/{1.73_m2} (ref >90)
Glucose: 84 mg/dl (ref 70–140)
Potassium: 4.2 mEq/L (ref 3.5–5.1)
Sodium: 140 mEq/L (ref 136–145)
TOTAL PROTEIN: 8.6 g/dL — AB (ref 6.4–8.3)

## 2016-07-27 LAB — LACTATE DEHYDROGENASE: LDH: 157 U/L (ref 125–245)

## 2016-07-27 MED ORDER — INFLUENZA VAC SPLIT QUAD 0.5 ML IM SUSY
0.5000 mL | PREFILLED_SYRINGE | Freq: Once | INTRAMUSCULAR | Status: AC
  Administered 2016-07-27: 0.5 mL via INTRAMUSCULAR
  Filled 2016-07-27: qty 0.5

## 2016-07-27 NOTE — Progress Notes (Signed)
Per Dr. Irene Limbo, research department notified that patient is candidate for lymphoma trial.  Remer Macho aware, will pass info along to research RN.  Pt made aware that research RN will be contacting him with more information.

## 2016-07-28 ENCOUNTER — Other Ambulatory Visit: Payer: Self-pay | Admitting: Radiology

## 2016-07-28 LAB — HEPATITIS B SURFACE ANTIGEN: HBsAg Screen: NEGATIVE

## 2016-07-28 LAB — SEDIMENTATION RATE: Sedimentation Rate-Westergren: 43 mm/hr — ABNORMAL HIGH (ref 0–15)

## 2016-07-28 LAB — HEPATITIS C ANTIBODY

## 2016-07-29 ENCOUNTER — Encounter: Payer: Self-pay | Admitting: *Deleted

## 2016-07-29 ENCOUNTER — Other Ambulatory Visit: Payer: Self-pay | Admitting: General Surgery

## 2016-07-29 ENCOUNTER — Other Ambulatory Visit: Payer: Self-pay | Admitting: Hematology

## 2016-07-29 ENCOUNTER — Other Ambulatory Visit: Payer: Self-pay | Admitting: *Deleted

## 2016-07-29 ENCOUNTER — Other Ambulatory Visit: Payer: Commercial Managed Care - HMO

## 2016-07-29 DIAGNOSIS — C8118 Nodular sclerosis classical Hodgkin lymphoma, lymph nodes of multiple sites: Secondary | ICD-10-CM

## 2016-07-29 MED ORDER — DEXAMETHASONE 4 MG PO TABS: ORAL_TABLET | ORAL | 1 refills | Status: DC

## 2016-07-29 MED ORDER — ONDANSETRON HCL 8 MG PO TABS: 8.0000 mg | ORAL_TABLET | Freq: Two times a day (BID) | ORAL | 1 refills | Status: DC | PRN

## 2016-07-29 MED ORDER — LIDOCAINE-PRILOCAINE 2.5-2.5 % EX CREA: TOPICAL_CREAM | CUTANEOUS | 3 refills | Status: DC

## 2016-07-29 MED ORDER — PROCHLORPERAZINE MALEATE 10 MG PO TABS: 10.0000 mg | ORAL_TABLET | Freq: Four times a day (QID) | ORAL | 1 refills | Status: DC | PRN

## 2016-07-29 NOTE — Progress Notes (Signed)
START ON PATHWAY REGIMEN - Lymphoma and CLL  LYOS296: ABVD q28 Days x 4 Cycles Followed by 30 Gy IFRT   A cycle is every 28 days:     Doxorubicin (Adriamycin(R)) 25 mg/m2 IV Push days 1 and 15 Dose Mod: None     Dacarbazine 375 mg/m2 in 250 mL D5W IV over 60 minutes days 1 and 15 Dose Mod: None     Vinblastine (Velban(R)) 6 mg/m2 in 50 ml NS IV over 10 minutes days 1 and 15 Dose Mod: None     Bleomycin (Blenoxane(R)) 10 mg/m2 in 50 mL NS IV over 20 minutes days 1 and 15 **SEE ADDITIONAL ORDER** Dose Mod: None Additional Orders: Bleomycin 1mg  subcut as test dose prior to first dose only.  Wait 1 hour prior to administration of subsequent dose if there is no anaphylactoid reaction. Schedule Day 15 chemotherapy. Bleomycin: 1 unit = 1 mg.  PFTs at baseline and as clinically indicated.  **Always confirm dose/schedule in your pharmacy ordering system**    Patient Characteristics: Classic Hodgkin Lymphoma, First Line, Stage I / II, Early Unfavorable with  Risk Factors Other  Than Bulky Mediastinal Disease, Age < 25 Disease Type: Classic Hodgkin Lymphoma Disease Type: Not Applicable Line of therapy: First Line [place] Stage: IIA First Line, Stage I/II Disease Characteristics: Early Unfavorable with Risk Factors Other Than Bulky Mediastinal Disease Age: < 6  Intent of Therapy: Curative Intent, Discussed with Patient

## 2016-07-29 NOTE — Progress Notes (Signed)
[name]    HEMATOLOGY/ONCOLOGY CONSULTATION NOTE  Date of Service: 07/29/2016  Patient Care Team: No Pcp Per Patient as PCP - General (General Practice)  CHIEF COMPLAINTS/PURPOSE OF CONSULTATION:  Newly diagnosed classical [name]   HISTORY OF PRESENTING ILLNESS:   Craig Schmitt is a wonderful 30 y.o. male who has been referred to [place] by Dr .Jackolyn Confer MD for evaluation and management of newly diagnosed classical [name].  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 The lymph nodes in his neck were progressively growing and he noted some enlarged lymph nodes under his left armpit  and therefore sought additional attention.   He had a CT of the neck  on 05/24/2016 that showed multiple enlarged lymph nodes in the left neck concerning for possible lymphoma. No pharyngeal masses were noted. CT of the chest/Abd done the same day showed left posterior cervical, supraclavicular, left axillary, mediastinal and right hilar lymphadenopathy highly suspicious for lymphoma. No findings suspicious for lymphoma beneath the diaphragm. Spleen was normal in size.  Patient was seen by Dr. Jackolyn Confer had a left cervical lymph node biopsy on 07/20/2016 that showed classical [name], nodular sclerosis type.  Patient reports no fevers no chills no night sweats no weight loss no fatigue. No change in his breathing. No chest pain. No abdominal pain or discomfort. No change in bowel habits. No headaches or focal neurological deficits.  He is here for his clinic visit with his brother for discussion of his diagnosis. He was not aware of the final pathologic diagnosis till we discussed it in clinic today.   MEDICAL HISTORY:  1)Hives to pollen  SURGICAL HISTORY: Past Surgical History:  Procedure Laterality Date  .  KNEE ARTHROSCOPY    . LYMPH GLAND EXCISION Left 07/20/2016   Procedure: LEFT CERVICAL LYMPH NODE BIOPSY;  Surgeon: Jackolyn Confer, MD;  Location: [facility];  Service: General;  Laterality: Left;    SOCIAL HISTORY: Social History   Social History  . Marital status: Unknown    Spouse name: N/A  . Number of children: N/A  . Years of education: N/A   Occupational History  . Not on file.   Social History Main Topics  . Smoking status: Never Smoker  . Smokeless tobacco: Never Used  . Alcohol use No  . Drug use: No  . Sexual activity: Not on file   Other Topics Concern  . Not on file   Social History Narrative  . No narrative on file  Never smoker   no significant alcohol use  FAMILY HISTORY: family history of hypertension  No known family history of blood disorders or cancer .  ALLERGIES:  has No Known Allergies.  Allergic to pollen no other known drug or food allergies .  MEDICATIONS:  Current Outpatient Prescriptions  Medication Sig Dispense Refill  . ibuprofen (ADVIL,MOTRIN) 200 MG tablet Take 200 mg by mouth every 6 (six) hours as needed for mild pain.     No current facility-administered medications for this visit.     REVIEW OF SYSTEMS:    10 Point review of Systems was done is negative except as noted above.  PHYSICAL EXAMINATION: ECOG PERFORMANCE STATUS: 0 - Asymptomatic  . Vitals:   07/27/16 0841  BP: (!) 145/75  Pulse: 61  Resp: 18  Temp: 98.1 F (36.7 C)   Filed  Weights   07/27/16 0841  Weight: 264 lb 1.6 oz (119.8 kg)   .Body mass index is 34.84 kg/m.  GENERAL:alert, well-built African-American gentleman in no acute distress and comfortable SKIN: skin color, texture, turgor are normal, no rashes or significant lesions EYES: normal, conjunctiva are pink and non-injected, sclera clear OROPHARYNX:no exudate, no erythema and lips, buccal mucosa, and tongue normal  NECK: supple, no JVD, thyroid normal size, non-tender, without  nodularity LYMPH:  Palpable left cervical , left supraclavicular and left axillary lymph nodes .no palpable inguinal lymph nodes.  LUNGS: clear to auscultation with normal respiratory effort HEART: regular rate & rhythm,  no murmurs and no lower extremity edema ABDOMEN: abdomen soft, non-tender, normoactive bowel sounds , no palpable hepatosplenomegaly . Musculoskeletal: no cyanosis of digits and no clubbing  PSYCH: alert & oriented x 3 with fluent speech NEURO: no focal motor/sensory deficits  LABORATORY DATA:  I have reviewed the data as listed  . CBC Latest Ref Rng & Units 07/27/2016 05/18/2016  WBC 4.0 - 10.3 10e3/uL 15.5(H) 11.7(H)  Hemoglobin 13.0 - 17.1 g/dL 13.8 11.5(L)  Hematocrit 38.4 - 49.9 % 42.9 36.3(L)  Platelets 140 - 400 10e3/uL 259 279    . CMP Latest Ref Rng & Units 07/27/2016 05/18/2016  Glucose 70 - 140 mg/dl 84 89  BUN 7.0 - 26.0 mg/dL 15.2 14  Creatinine 0.7 - 1.3 mg/dL 1.1 1.04  Sodium 136 - 145 mEq/L 140 137  Potassium 3.5 - 5.1 mEq/L 4.2 4.1  Chloride 101 - 111 mmol/L - 107  CO2 22 - 29 mEq/L 24 26  Calcium 8.4 - 10.4 mg/dL 10.0 8.9  Total Protein 6.4 - 8.3 g/dL 8.6(H) 7.6  Total Bilirubin 0.20 - 1.20 mg/dL 0.35 0.2(L)  Alkaline Phos 40 - 150 U/L 96 71  AST 5 - 34 U/L 16 16  ALT 0 - 55 U/L 18 16(L)   Component     Latest Ref Rng & Units 07/27/2016  Sed Rate     0 - 15 mm/hr 43 (H)  LDH     125 - 245 U/L 157  Hep C Virus Ab     0.0 - 0.9 s/co ratio <0.1  Hepatitis B Surface Ag     Negative Negative      RADIOGRAPHIC STUDIES: I have personally reviewed the radiological images as listed and agreed with the findings in the report.  CLINICAL DATA:  Cervical lymphadenopathy.  Left arm swelling EXAM: CT NECK WITH CONTRAST TECHNIQUE: Multidetector CT imaging of the neck was performed using the standard protocol following the bolus administration of intravenous contrast. CONTRAST:  50 mL Isovue-300 IV COMPARISON:  None. FINDINGS: Pharynx and  larynx: Normal pharynx. No mass or abscess. Tongue is normal. Epiglottis and vocal cords normal. Salivary glands: Parotid and submandibular glands normal bilaterally. No mass or calculi. Thyroid: Negative Lymph nodes: Numerous enlarged lymph nodes in the left neck. Enlarged lymph nodes deep to the sternocleidomastoid muscle measuring 24 x 16 mm, 17 x 18 mm, enlarged posterior lymph nodes measuring 18 x 23 mm, 18 x 20 mm, and 21 x 31 mm. Numerous enlarged lymph nodes in the left axilla. No enlarged lymph nodes in the right neck. No enlarged lymph nodes in the submandibular region. Vascular: Carotid artery and jugular vein patent bilaterally. The left subclavian vein appears patent although not optimally evaluated due to late scanning and minimal enhancement. Limited intracranial: Negative Visualized orbits: Negative Mastoids and visualized paranasal sinuses: Mild mucosal edema left maxillary sinus. Mild mucosal  edema right maxillary sinus. Mastoid sinus clear bilaterally. Skeleton: Negative for fracture. No bony lesion. Dental caries involving the molars. Upper chest: Lung apices clear. Small anterior mediastinal lymph nodes are present 100 measuring 1 cm. IMPRESSION: Multiple enlarged lymph nodes in the left neck, concerning for malignancy such as lymphoma. Biopsy recommended. No pharyngeal mass identified. Dental caries. Electronically Signed   By: Franchot Gallo M.D.   On: 05/24/2016 16:16  CLINICAL DATA:  Cervical lymphadenopathy x8 months, left arm swelling x1 week, left axillary lymphadenopathy EXAM: CT CHEST, ABDOMEN, AND PELVIS WITH CONTRAST TECHNIQUE: Multidetector CT imaging of the chest, abdomen and pelvis was performed following the standard protocol during bolus administration of intravenous contrast. CONTRAST:  16mL ISOVUE-300 IOPAMIDOL (ISOVUE-300) INJECTION 61% COMPARISON:  None. FINDINGS: CT CHEST FINDINGS Cardiovascular: Heart is normal in size.  No  pericardial effusion. No evidence of thoracic aortic aneurysm. Mediastinum/Nodes: Thoracic lymphadenopathy, including: --3.2 cm short axis left posterior cervical/supraclavicular lymph nodes (series 2/image 6) --3.2 cm short axis left axillary lymph nodes (series 2/image 18) --1.9 cm short axis anterior/superior mediastinal lymph nodes (series 2/ image 34) --1.4 cm short axis low right paratracheal node (series 2/ image 36) --1.6 cm short axis right hilar node (series 2/ image 42) --1.6 cm short axis subcarinal node (series 2/ image 44) Visualized thyroid is unremarkable. Lungs/Pleura: Lungs are clear. No suspicious pulmonary nodules. No focal consolidation. No pleural effusion or pneumothorax. Musculoskeletal: Visualized osseous structures are within normal limits. CT ABDOMEN PELVIS FINDINGS Hepatobiliary: Liver is within normal limits. No suspicious/enhancing hepatic lesions. Gallbladder is within normal limits. No intrahepatic or extrahepatic ductal dilatation. Pancreas: Within normal limits. Spleen: Normal in size. Adrenals/Urinary Tract: Adrenal glands are within normal limits. Kidneys are within normal limits.  No hydronephrosis. Bladder is within normal limits. Stomach/Bowel: Stomach is within normal limits. No evidence of bowel obstruction. Normal appendix (series 2/ image 167). Vascular/Lymphatic: No evidence of abdominal aortic aneurysm. No suspicious abdominopelvic lymphadenopathy. Reproductive: Prostate is within normal limits. Other: No abdominopelvic ascites. Musculoskeletal: Visualized osseous structures are within normal limits. IMPRESSION: Left posterior cervical/supraclavicular, left axillary, mediastinal, and right hilar lymphadenopathy, as described above. This appearance is highly suspicious for lymphoma. Within the chest/abdomen/pelvis, the left axillary nodes would be most accessible for percutaneous sampling/surgical excision. No findings suspicious  for lymphoma beneath the diaphragm. Spleen is normal in size. Electronically Signed   By: Julian Hy M.D.   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 Likely stage IIA as per CT scans more than 2 months ago. We'll need to be restaged. Sedimentation rate 43 Plan -Patient's diagnosis, prognosis, treatment options were discussed in details with the patient and his brother and all their questions were answered in great details. Reading information was provided regarding [name] and the specific chemotherapy regimen. -I discussed the pros and cons of the standard treatment with ABVD. -he will be set up for official chemotherapy counseling. -PET/CT scan for accurate pretreatment staging and to determine treatment strategy and to have baseline for posttreatment reevaluation. -Would plan to treat him with ABVD. Number of cycles and role of ISRT based on final PET scan results. -Pre-anthracycline ECHO -PFTs -Port placement  -He was counseled regarding the possibility of infertility and was given contact information for [place] [facility] to consider sperm banking. He was not very inclined to pursue this but  we had a detailed discussion regarding the possible importance of this . -He was given a flu shot today with his consent  -Baseline labs were obtained today   . Orders Placed This Encounter  Procedures  . NM PET Image Initial (PI) Skull Base To Thigh    Standing Status:   Future    Standing Expiration Date:   08/31/2017    Order Specific Question:   Reason for exam:    Answer:   newly diagnosed [name] for initial staging    Order Specific Question:   Preferred imaging location?    Answer:   [facility]  . IR FLUORO GUIDE PORT INSERTION RIGHT    Standing Status:    Future    Standing Expiration Date:   09/26/2017    Order Specific Question:   Reason for Exam (SYMPTOM  OR DIAGNOSIS REQUIRED)    Answer:   Port placement for chemotherapy for newly diagnosed [name] ASAP    Order Specific Question:   Preferred Imaging Location?    Answer:   [facility]  . CBC & Diff and Retic    Standing Status:   Future    Number of Occurrences:   1    Standing Expiration Date:   07/27/2017  . Comprehensive metabolic panel    Standing Status:   Future    Number of Occurrences:   1    Standing Expiration Date:   07/27/2017  . Sedimentation rate    Standing Status:   Future    Number of Occurrences:   1    Standing Expiration Date:   07/27/2017  . Lactate dehydrogenase    Standing Status:   Future    Number of Occurrences:   1    Standing Expiration Date:   07/27/2017  . Hepatitis C antibody    Standing Status:   Future    Number of Occurrences:   1    Standing Expiration Date:   07/27/2017  . Hepatitis B surface antigen    Standing Status:   Future    Number of Occurrences:   1    Standing Expiration Date:   07/27/2017  . ECHOCARDIOGRAM COMPLETE    Standing Status:   Future    Standing Expiration Date:   10/26/2017    Order Specific Question:   Where should this test be performed    Answer:   [facility]    Order Specific Question:   Complete or Limited study?    Answer:   Complete    Order Specific Question:   With Image Enhancing Agent or without Image Enhancing Agent?    Answer:   With Image Enhancing Agent    Order Specific Question:   Reason for exam-Echo    Answer:   Chemotherapy evaluation  v87.41 / v58.11    Order Specific Question:   Reason for exam-Echo    Answer:   Chemo  V67.2 / Z09  . Pulmonary Function Test    Standing Status:   Future    Standing Expiration Date:   07/27/2017    Order Specific Question:   Where should this test be performed?    Answer:   [facility]    Order Specific Question:   Full PFT: includes the  following: basic spirometry, spirometry pre & post bronchodilator, diffusion capacity (DLCO), lung volumes    Answer:   FULL PFT Without spirometry post bronchodilator    Order Specific Question:   MIP/MEP  Answer:   Yes    Order Specific Question:   6 minute walk    Answer:   Yes    Order Specific Question:   ABG    Answer:   No    Order Specific Question:   Diffusion capacity (DLCO)    Answer:   Yes    Order Specific Question:   Lung volumes    Answer:   Yes    Order Specific Question:   Methacholine challenge    Answer:   No     All of the patients and his brothers questions were answered to their apparent satisfaction. The patient knows to call the clinic with any problems, questions or concerns.  I spent 60 minutes counseling the patient face to face. The total time spent in the appointment was 80 minutes and more than 50% was on counseling and direct patient cares.    Sullivan Lone MD Imogene AAHIVMS [facility] Hematology/Oncology Physician [facility]  (Office):       [phone] (Work cell):  [phone] (Fax):           [phone]

## 2016-07-30 ENCOUNTER — Ambulatory Visit (HOSPITAL_COMMUNITY)
Admission: RE | Admit: 2016-07-30 | Discharge: 2016-07-30 | Disposition: A | Discharge status: Home / Self Care | Payer: Commercial Managed Care - HMO | Source: Ambulatory Visit | Attending: Hematology | Admitting: Hematology

## 2016-07-30 ENCOUNTER — Other Ambulatory Visit: Payer: Self-pay | Admitting: Radiology

## 2016-07-30 ENCOUNTER — Other Ambulatory Visit (HOSPITAL_COMMUNITY): Payer: Commercial Managed Care - HMO

## 2016-07-30 ENCOUNTER — Other Ambulatory Visit (HOSPITAL_COMMUNITY): Payer: Self-pay | Admitting: *Deleted

## 2016-07-30 DIAGNOSIS — C8118 Nodular sclerosis classical Hodgkin lymphoma, lymph nodes of multiple sites: Secondary | ICD-10-CM

## 2016-07-30 LAB — PULMONARY FUNCTION TEST
DL/VA % PRED: 110 %
DL/VA: 5.38 ml/min/mmHg/L
DLCO UNC: 34.48 ml/min/mmHg
DLCO cor % pred: 97 %
DLCO cor: 35.3 ml/min/mmHg
DLCO unc % pred: 94 %
FEF 25-75 Post: 5.32 L/sec
FEF 25-75 Pre: 4.41 L/sec
FEF2575-%CHANGE-POST: 20 %
FEF2575-%PRED-POST: 117 %
FEF2575-%Pred-Pre: 97 %
FEV1-%CHANGE-POST: 3 %
FEV1-%Pred-Post: 108 %
FEV1-%Pred-Pre: 104 %
FEV1-PRE: 4.39 L
FEV1-Post: 4.56 L
FEV1FVC-%Change-Post: 6 %
FEV1FVC-%Pred-Pre: 97 %
FEV6-%Change-Post: -1 %
FEV6-%PRED-POST: 104 %
FEV6-%Pred-Pre: 106 %
FEV6-PRE: 5.33 L
FEV6-Post: 5.23 L
FEV6FVC-%CHANGE-POST: 0 %
FEV6FVC-%PRED-PRE: 100 %
FEV6FVC-%Pred-Post: 100 %
FVC-%CHANGE-POST: -1 %
FVC-%PRED-POST: 103 %
FVC-%Pred-Pre: 105 %
FVC-Post: 5.23 L
FVC-Pre: 5.34 L
POST FEV1/FVC RATIO: 87 %
PRE FEV6/FVC RATIO: 100 %
Post FEV6/FVC ratio: 100 %
Pre FEV1/FVC ratio: 82 %
RV % PRED: 104 %
RV: 1.82 L
TLC % pred: 92 %
TLC: 6.97 L

## 2016-07-30 MED ORDER — ALBUTEROL SULFATE (2.5 MG/3ML) 0.083% IN NEBU
2.5000 mg | INHALATION_SOLUTION | Freq: Once | RESPIRATORY_TRACT | Status: AC
  Administered 2016-07-30: 2.5 mg via RESPIRATORY_TRACT

## 2016-08-02 ENCOUNTER — Other Ambulatory Visit: Payer: Self-pay | Admitting: Hematology

## 2016-08-02 ENCOUNTER — Encounter (HOSPITAL_COMMUNITY): Payer: Self-pay

## 2016-08-02 ENCOUNTER — Ambulatory Visit (HOSPITAL_COMMUNITY)
Admission: RE | Admit: 2016-08-02 | Discharge: 2016-08-02 | Disposition: A | Discharge status: Home / Self Care | Payer: Commercial Managed Care - HMO | Source: Ambulatory Visit | Attending: Hematology | Admitting: Hematology

## 2016-08-02 ENCOUNTER — Telehealth: Payer: Self-pay

## 2016-08-02 DIAGNOSIS — Z9221 Personal history of antineoplastic chemotherapy: Secondary | ICD-10-CM | POA: Insufficient documentation

## 2016-08-02 DIAGNOSIS — C8198 Hodgkin lymphoma, unspecified, lymph nodes of multiple sites: Secondary | ICD-10-CM | POA: Diagnosis not present

## 2016-08-02 DIAGNOSIS — C8118 Nodular sclerosis classical Hodgkin lymphoma, lymph nodes of multiple sites: Secondary | ICD-10-CM

## 2016-08-02 HISTORY — PX: IR GENERIC HISTORICAL: IMG1180011

## 2016-08-02 HISTORY — DX: Malignant (primary) neoplasm, unspecified: C80.1

## 2016-08-02 LAB — CBC WITH DIFFERENTIAL/PLATELET
BASOS PCT: 0 %
Basophils Absolute: 0 10*3/uL (ref 0.0–0.1)
EOS PCT: 7 %
Eosinophils Absolute: 0.9 10*3/uL — ABNORMAL HIGH (ref 0.0–0.7)
HEMATOCRIT: 39.5 % (ref 39.0–52.0)
HEMOGLOBIN: 13.4 g/dL (ref 13.0–17.0)
LYMPHS PCT: 14 %
Lymphs Abs: 1.8 10*3/uL (ref 0.7–4.0)
MCH: 27.2 pg (ref 26.0–34.0)
MCHC: 33.9 g/dL (ref 30.0–36.0)
MCV: 80.1 fL (ref 78.0–100.0)
MONOS PCT: 7 %
Monocytes Absolute: 0.9 10*3/uL (ref 0.1–1.0)
NEUTROS PCT: 72 %
Neutro Abs: 9.1 10*3/uL — ABNORMAL HIGH (ref 1.7–7.7)
Platelets: 283 10*3/uL (ref 150–400)
RBC: 4.93 MIL/uL (ref 4.22–5.81)
RDW: 16.1 % — ABNORMAL HIGH (ref 11.5–15.5)
WBC: 12.7 10*3/uL — AB (ref 4.0–10.5)

## 2016-08-02 LAB — PROTIME-INR
INR: 1.01
Prothrombin Time: 13.3 seconds (ref 11.4–15.2)

## 2016-08-02 MED ORDER — MIDAZOLAM HCL 2 MG/2ML IJ SOLN
INTRAMUSCULAR | Status: AC | PRN
  Administered 2016-08-02 (×2): 1 mg via INTRAVENOUS
  Administered 2016-08-02 (×2): 0.5 mg via INTRAVENOUS

## 2016-08-02 MED ORDER — FENTANYL CITRATE (PF) 100 MCG/2ML IJ SOLN
INTRAMUSCULAR | Status: AC
  Filled 2016-08-02: qty 4

## 2016-08-02 MED ORDER — HEPARIN SOD (PORK) LOCK FLUSH 100 UNIT/ML IV SOLN
INTRAVENOUS | Status: AC
  Filled 2016-08-02: qty 5

## 2016-08-02 MED ORDER — NALOXONE HCL 0.4 MG/ML IJ SOLN
INTRAMUSCULAR | Status: AC
  Filled 2016-08-02: qty 1

## 2016-08-02 MED ORDER — FLUMAZENIL 0.5 MG/5ML IV SOLN
INTRAVENOUS | Status: AC
  Filled 2016-08-02: qty 5

## 2016-08-02 MED ORDER — MIDAZOLAM HCL 2 MG/2ML IJ SOLN
INTRAMUSCULAR | Status: AC
  Filled 2016-08-02: qty 4

## 2016-08-02 MED ORDER — LIDOCAINE HCL 1 % IJ SOLN
INTRAMUSCULAR | Status: AC
  Filled 2016-08-02: qty 20

## 2016-08-02 MED ORDER — HEPARIN SODIUM (PORCINE) 1000 UNIT/ML IJ SOLN
INTRAMUSCULAR | Status: AC | PRN
  Administered 2016-08-02: 5000 [IU] via INTRAVENOUS

## 2016-08-02 MED ORDER — SODIUM CHLORIDE 0.9 % IV SOLN
INTRAVENOUS | Status: DC
  Administered 2016-08-02: 08:00:00 via INTRAVENOUS

## 2016-08-02 MED ORDER — LIDOCAINE HCL 1 % IJ SOLN
INTRAMUSCULAR | Status: AC | PRN
  Administered 2016-08-02: 15 mL

## 2016-08-02 MED ORDER — CEFAZOLIN SODIUM-DEXTROSE 2-4 GM/100ML-% IV SOLN
2.0000 g | INTRAVENOUS | Status: AC
  Administered 2016-08-02: 2 g via INTRAVENOUS
  Filled 2016-08-02: qty 100

## 2016-08-02 MED ORDER — FENTANYL CITRATE (PF) 100 MCG/2ML IJ SOLN
INTRAMUSCULAR | Status: AC | PRN
  Administered 2016-08-02: 25 ug via INTRAVENOUS
  Administered 2016-08-02: 50 ug via INTRAVENOUS
  Administered 2016-08-02: 25 ug via INTRAVENOUS
  Administered 2016-08-02: 50 ug via INTRAVENOUS

## 2016-08-02 NOTE — Telephone Encounter (Signed)
Faxed fmla paperwork to city of [place] [employer]

## 2016-08-02 NOTE — Procedures (Signed)
Interventional Radiology Procedure Note  Procedure: Placement of a right IJ approach single lumen PowerPort.  Tip is positioned at the superior cavoatrial junction and catheter is ready for immediate use.  Complications: No immediate Recommendations:  - Ok to shower tomorrow - Do not submerge for 7 days - Routine line care   Asaad Gulley T. Brien Lowe, M.D Pager:  [phone]   

## 2016-08-02 NOTE — H&P (Signed)
Chief Complaint: [name]  Referring Physician:Dr. Sullivan Schmitt  Supervising Physician: Craig Edouard  Patient Status: Out-pt  HPI: Craig Schmitt is an 30 y.o. male who recently underwent a cervical LN BX due to increasing lymphadenopathy.  He was found to have [name].  He was referred to Dr. Irene Schmitt.  After evaluation, he would like to get started with chemotherapy.  A request has been made for a PAC.  The patient presents today for this procedure.  He has no complaints and is feeling well.  Past Medical History:  Past Medical History:  Diagnosis Date  . Cancer ([place])    hodgkins lymphoma-Sept.2017    Past Surgical History:  Past Surgical History:  Procedure Laterality Date  . KNEE ARTHROSCOPY    . LYMPH GLAND EXCISION Left 07/20/2016   Procedure: LEFT CERVICAL LYMPH NODE BIOPSY;  Surgeon: Craig Confer, MD;  Location: [facility];  Service: General;  Laterality: Left;    Family History: History reviewed. No pertinent family history.  Social History:  reports that he has never smoked. He has never used smokeless tobacco. He reports that he does not drink alcohol or use drugs.  Allergies: No Known Allergies  Medications: Medications reviewed in Epic  Please HPI for pertinent positives, otherwise complete 10 system ROS negative.  Mallampati Score: MD Evaluation Airway: WNL Heart: WNL Abdomen: WNL Chest/ Lungs: WNL ASA  Classification: 1 Mallampati/Airway Score: One  Physical Exam: BP 129/72 (BP Location: Left Arm)   Pulse 60   Temp 97.9 F (36.6 C) (Oral)   Resp 18   SpO2 98%  There is no height or weight on file to calculate BMI. General: pleasant, WD, WN black male who is laying in bed in NAD HEENT: head is normocephalic, atraumatic.  Sclera are noninjected.  PERRL.  Ears and nose without any masses or lesions.  Mouth is pink and moist Heart: regular, rate, and rhythm.  Normal s1,s2. No obvious murmurs, gallops, or  rubs noted.  Palpable radial and pedal pulses bilaterally Lungs: CTAB, no wheezes, rhonchi, or rales noted.  Respiratory effort nonlabored Abd: soft, NT, ND, +BS, no masses, hernias, or organomegaly MS: all 4 extremities are symmetrical with no cyanosis, clubbing, or edema. Psych: A&Ox3 with an appropriate affect.    Labs: Results for orders placed or performed during the hospital encounter of 08/02/16 (from the past 48 hour(s))  CBC with Differential/Platelet     Status: Abnormal   Collection Time: 08/02/16  8:00 AM  Result Value Ref Range   WBC 12.7 (H) 4.0 - 10.5 K/uL   RBC 4.93 4.22 - 5.81 MIL/uL   Hemoglobin 13.4 13.0 - 17.0 g/dL   HCT 39.5 39.0 - 52.0 %   MCV 80.1 78.0 - 100.0 fL   MCH 27.2 26.0 - 34.0 pg   MCHC 33.9 30.0 - 36.0 g/dL   RDW 16.1 (H) 11.5 - 15.5 %   Platelets 283 150 - 400 K/uL   Neutrophils Relative % 72 %   Lymphocytes Relative 14 %   Monocytes Relative 7 %   Eosinophils Relative 7 %   Basophils Relative 0 %   Neutro Abs 9.1 (H) 1.7 - 7.7 K/uL   Lymphs Abs 1.8 0.7 - 4.0 K/uL   Monocytes Absolute 0.9 0.1 - 1.0 K/uL   Eosinophils Absolute 0.9 (H) 0.0 - 0.7 K/uL   Basophils Absolute 0.0 0.0 - 0.1 K/uL   Smear Review MORPHOLOGY UNREMARKABLE   Protime-INR     Status:  None   Collection Time: 08/02/16  8:00 AM  Result Value Ref Range   Prothrombin Time 13.3 11.4 - 15.2 seconds   INR 1.01     Imaging: No results found.  Assessment/Plan 1. [name] -we will plan to proceed with PAC placement today -his labs and vitals have been reviewed.  His WBC is slightly elevated at 12, which is likely secondary to his lymphoma.  This is actually decreased from recent WBC of 15 last week.  He denies any infectious symptoms. -Risks and Benefits discussed with the patient including, but not limited to bleeding, infection, pneumothorax, or fibrin sheath development and need for additional procedures. All of the patient's questions were answered, patient is  agreeable to proceed. Consent signed and in chart.  Thank you for this interesting consult.  I greatly enjoyed meeting Craig Schmitt and look forward to participating in their care.  A copy of this report was sent to the requesting provider on this date.  Electronically Signed: Henreitta Schmitt 08/02/2016, 8:43 AM   I spent a total of  30 Minutes   in face to face in clinical consultation, greater than 50% of which was counseling/coordinating care for hodgkin's lymphoma

## 2016-08-02 NOTE — Discharge Instructions (Signed)
Implanted Port Insertion, Care After °Refer to this sheet in the next few weeks. These instructions provide you with information on caring for yourself after your procedure. Your health care provider may also give you more specific instructions. Your treatment has been planned according to current medical practices, but problems sometimes occur. Call your health care provider if you have any problems or questions after your procedure. °WHAT TO EXPECT AFTER THE PROCEDURE °After your procedure, it is typical to have the following:  °· Discomfort at the port insertion site. Ice packs to the area will help. °· Bruising on the skin over the port. This will subside in 3-4 days. °HOME CARE INSTRUCTIONS °· After your port is placed, you will get a manufacturer's information card. The card has information about your port. Keep this card with you at all times.   °· Know what kind of port you have. There are many types of ports available.   °· Wear a medical alert bracelet in case of an emergency. This can help alert health care workers that you have a port.   °· The port can stay in for as long as your health care provider believes it is necessary.   °· A home health care nurse may give medicines and take care of the port.   °· You or a family member can get special training and directions for giving medicine and taking care of the port at home.   °SEEK MEDICAL CARE IF:  °· Your port does not flush or you are unable to get a blood return.   °· You have a fever or chills. °SEEK IMMEDIATE MEDICAL CARE IF: °· You have new fluid or pus coming from your incision.   °· You notice a bad smell coming from your incision site.   °· You have swelling, pain, or more redness at the incision or port site.   °· You have chest pain or shortness of breath. °  °This information is not intended to replace advice given to you by your health care provider. Make sure you discuss any questions you have with your health care provider. °  °Document  Released: 08/08/2013 Document Revised: 10/23/2013 Document Reviewed: 08/08/2013 °Elsevier Interactive Patient Education ©2016 [employer] °Implanted Port Home Guide °An implanted port is a type of central line that is placed under the skin. Central lines are used to provide IV access when treatment or nutrition needs to be given through a person's veins. Implanted ports are used for long-term IV access. An implanted port may be placed because:  °· You need IV medicine that would be irritating to the small veins in your hands or arms.   °· You need long-term IV medicines, such as antibiotics.   °· You need IV nutrition for a long period.   °· You need frequent blood draws for lab tests.   °· You need dialysis.   °Implanted ports are usually placed in the chest area, but they can also be placed in the upper arm, the abdomen, or the leg. An implanted port has two main parts:  °· Reservoir. The reservoir is round and will appear as a small, raised area under your skin. The reservoir is the part where a needle is inserted to give medicines or draw blood.   °· Catheter. The catheter is a thin, flexible tube that extends from the reservoir. The catheter is placed into a large vein. Medicine that is inserted into the reservoir goes into the catheter and then into the vein.   °HOW WILL I CARE FOR MY INCISION SITE? °Do not get the   incision site wet. Bathe or shower as directed by your health care provider.  °HOW IS MY PORT ACCESSED? °Special steps must be taken to access the port:  °· Before the port is accessed, a numbing cream can be placed on the skin. This helps numb the skin over the port site.   °· Your health care provider uses a sterile technique to access the port. °· Your health care provider must put on a mask and sterile gloves. °· The skin over your port is cleaned carefully with an antiseptic and allowed to dry. °· The port is gently pinched between sterile gloves, and a needle is inserted into the  port. °· Only "non-coring" port needles should be used to access the port. Once the port is accessed, a blood return should be checked. This helps ensure that the port is in the vein and is not clogged.   °· If your port needs to remain accessed for a constant infusion, a clear (transparent) bandage will be placed over the needle site. The bandage and needle will need to be changed every week, or as directed by your health care provider.   °· Keep the bandage covering the needle clean and dry. Do not get it wet. Follow your health care provider's instructions on how to take a shower or bath while the port is accessed.   °· If your port does not need to stay accessed, no bandage is needed over the port.   °WHAT IS FLUSHING? °Flushing helps keep the port from getting clogged. Follow your health care provider's instructions on how and when to flush the port. Ports are usually flushed with saline solution or a medicine called heparin. The need for flushing will depend on how the port is used.  °· If the port is used for intermittent medicines or blood draws, the port will need to be flushed:   °· After medicines have been given.   °· After blood has been drawn.   °· As part of routine maintenance.   °· If a constant infusion is running, the port may not need to be flushed.   °HOW LONG WILL MY PORT STAY IMPLANTED? °The port can stay in for as long as your health care provider thinks it is needed. When it is time for the port to come out, surgery will be done to remove it. The procedure is similar to the one performed when the port was put in.  °WHEN SHOULD I SEEK IMMEDIATE MEDICAL CARE? °When you have an implanted port, you should seek immediate medical care if:  °· You notice a bad smell coming from the incision site.   °· You have swelling, redness, or drainage at the incision site.   °· You have more swelling or pain at the port site or the surrounding area.   °· You have a fever that is not controlled with  medicine. °  °This information is not intended to replace advice given to you by your health care provider. Make sure you discuss any questions you have with your health care provider. °  °Document Released: 10/18/2005 Document Revised: 08/08/2013 Document Reviewed: 06/25/2013 °[employer]vier Interactive Patient Education ©2016 [employer] ° ° °Moderate Conscious Sedation, Adult, Care After °Refer to this sheet in the next few weeks. These instructions provide you with information on caring for yourself after your procedure. Your health care provider may also give you more specific instructions. Your treatment has been planned according to current medical practices, but problems sometimes occur. Call your health care provider if you have any problems or questions   after your procedure. °WHAT TO EXPECT AFTER THE PROCEDURE  °After your procedure: °· You may feel sleepy, clumsy, and have poor balance for several hours. °· Vomiting may occur if you eat too soon after the procedure. °HOME CARE INSTRUCTIONS °· Do not participate in any activities where you could become injured for at least 24 hours. Do not: °¨ Drive. °¨ Swim. °¨ Ride a bicycle. °¨ Operate heavy machinery. °¨ Cook. °¨ Use power tools. °¨ Climb ladders. °¨ Work from a high place. °· Do not make important decisions or sign legal documents until you are improved. °· If you vomit, drink water, juice, or soup when you can drink without vomiting. Make sure you have little or no nausea before eating solid foods. °· Only take over-the-counter or prescription medicines for pain, discomfort, or fever as directed by your health care provider. °· Make sure you and your family fully understand everything about the medicines given to you, including what side effects may occur. °· You should not drink alcohol, take sleeping pills, or take medicines that cause drowsiness for at least 24 hours. °· If you smoke, do not smoke without supervision. °· If you are feeling better, you  may resume normal activities 24 hours after you were sedated. °· Keep all appointments with your health care provider. °SEEK MEDICAL CARE IF: °· Your skin is pale or bluish in color. °· You continue to feel nauseous or vomit. °· Your pain is getting worse and is not helped by medicine. °· You have bleeding or swelling. °· You are still sleepy or feeling clumsy after 24 hours. °SEEK IMMEDIATE MEDICAL CARE IF: °· You develop a rash. °· You have difficulty breathing. °· You develop any type of allergic problem. °· You have a fever. °MAKE SURE YOU: °· Understand these instructions. °· Will watch your condition. °· Will get help right away if you are not doing well or get worse. °  °This information is not intended to replace advice given to you by your health care provider. Make sure you discuss any questions you have with your health care provider. °  °Document Released: 08/08/2013 Document Revised: 11/08/2014 Document Reviewed: 08/08/2013 °Elsevier Interactive Patient Education ©2016 [employer] ° °

## 2016-08-04 ENCOUNTER — Other Ambulatory Visit: Payer: Self-pay | Admitting: *Deleted

## 2016-08-05 ENCOUNTER — Encounter: Payer: Self-pay | Admitting: *Deleted

## 2016-08-06 ENCOUNTER — Telehealth: Payer: Self-pay | Admitting: Hematology

## 2016-08-06 NOTE — Telephone Encounter (Signed)
Left message for patient re echo at [facility] 10/10 and next appointment at [facility] for 10/13. Also confirmed pet scan already on schedule for 1012.  Patient has already has ched, port placement, pft.  Infusion area at capacity and next available for tx start is 10/13.

## 2016-08-10 ENCOUNTER — Ambulatory Visit (HOSPITAL_COMMUNITY)
Admission: RE | Admit: 2016-08-10 | Discharge: 2016-08-10 | Disposition: A | Discharge status: Home / Self Care | Payer: Commercial Managed Care - HMO | Source: Ambulatory Visit | Attending: Hematology | Admitting: Hematology

## 2016-08-10 DIAGNOSIS — C8118 Nodular sclerosis classical Hodgkin lymphoma, lymph nodes of multiple sites: Secondary | ICD-10-CM | POA: Insufficient documentation

## 2016-08-10 NOTE — Progress Notes (Signed)
  Echocardiogram 2D Echocardiogram has been performed.  Amoni Morales 08/10/2016, 10:55 AM

## 2016-08-12 ENCOUNTER — Ambulatory Visit (HOSPITAL_COMMUNITY)
Admission: RE | Admit: 2016-08-12 | Discharge: 2016-08-12 | Disposition: A | Discharge status: Home / Self Care | Payer: Commercial Managed Care - HMO | Source: Ambulatory Visit | Attending: Hematology | Admitting: Hematology

## 2016-08-12 ENCOUNTER — Encounter (HOSPITAL_COMMUNITY): Payer: Commercial Managed Care - HMO

## 2016-08-12 DIAGNOSIS — R911 Solitary pulmonary nodule: Secondary | ICD-10-CM | POA: Insufficient documentation

## 2016-08-12 DIAGNOSIS — C8118 Nodular sclerosis classical Hodgkin lymphoma, lymph nodes of multiple sites: Secondary | ICD-10-CM

## 2016-08-12 LAB — GLUCOSE, CAPILLARY: Glucose-Capillary: 101 mg/dL — ABNORMAL HIGH (ref 65–99)

## 2016-08-12 MED ORDER — FLUDEOXYGLUCOSE F - 18 (FDG) INJECTION
13.0200 | Freq: Once | INTRAVENOUS | Status: AC | PRN
  Administered 2016-08-12: 13.02 via INTRAVENOUS

## 2016-08-13 ENCOUNTER — Other Ambulatory Visit (HOSPITAL_BASED_OUTPATIENT_CLINIC_OR_DEPARTMENT_OTHER): Payer: Commercial Managed Care - HMO

## 2016-08-13 ENCOUNTER — Ambulatory Visit (HOSPITAL_BASED_OUTPATIENT_CLINIC_OR_DEPARTMENT_OTHER): Payer: Commercial Managed Care - HMO | Admitting: Hematology

## 2016-08-13 ENCOUNTER — Encounter: Payer: Self-pay | Admitting: Hematology

## 2016-08-13 ENCOUNTER — Ambulatory Visit (HOSPITAL_BASED_OUTPATIENT_CLINIC_OR_DEPARTMENT_OTHER): Payer: Commercial Managed Care - HMO

## 2016-08-13 VITALS — BP 145/85 | HR 81 | Temp 97.9°F | Resp 18 | Wt 265.6 lb

## 2016-08-13 DIAGNOSIS — C8118 Nodular sclerosis classical Hodgkin lymphoma, lymph nodes of multiple sites: Secondary | ICD-10-CM

## 2016-08-13 DIAGNOSIS — Z5111 Encounter for antineoplastic chemotherapy: Secondary | ICD-10-CM | POA: Diagnosis not present

## 2016-08-13 LAB — COMPREHENSIVE METABOLIC PANEL
ALBUMIN: 3.5 g/dL (ref 3.5–5.0)
ALT: 15 U/L (ref 0–55)
ANION GAP: 8 meq/L (ref 3–11)
AST: 15 U/L (ref 5–34)
Alkaline Phosphatase: 91 U/L (ref 40–150)
BILIRUBIN TOTAL: 0.4 mg/dL (ref 0.20–1.20)
BUN: 11.7 mg/dL (ref 7.0–26.0)
CALCIUM: 9.1 mg/dL (ref 8.4–10.4)
CHLORIDE: 104 meq/L (ref 98–109)
CO2: 25 mEq/L (ref 22–29)
CREATININE: 1.1 mg/dL (ref 0.7–1.3)
EGFR: >90 mL/min/{1.73_m2} (ref >90)
Glucose: 91 mg/dl (ref 70–140)
Potassium: 3.9 mEq/L (ref 3.5–5.1)
Sodium: 138 mEq/L (ref 136–145)
TOTAL PROTEIN: 8.2 g/dL (ref 6.4–8.3)

## 2016-08-13 LAB — CBC WITH DIFFERENTIAL/PLATELET
BASO%: 0.2 % (ref 0.0–2.0)
Basophils Absolute: 0 10*3/uL (ref 0.0–0.1)
EOS%: 4.6 % (ref 0.0–7.0)
Eosinophils Absolute: 0.6 10*3/uL — ABNORMAL HIGH (ref 0.0–0.5)
HEMATOCRIT: 40.4 % (ref 38.4–49.9)
HEMOGLOBIN: 12.9 g/dL — AB (ref 13.0–17.1)
LYMPH#: 1.1 10*3/uL (ref 0.9–3.3)
LYMPH%: 9.1 % — ABNORMAL LOW (ref 14.0–49.0)
MCH: 26.2 pg — ABNORMAL LOW (ref 27.2–33.4)
MCHC: 31.9 g/dL — AB (ref 32.0–36.0)
MCV: 82.2 fL (ref 79.3–98.0)
MONO#: 0.8 10*3/uL (ref 0.1–0.9)
MONO%: 6.7 % (ref 0.0–14.0)
NEUT%: 79.4 % — AB (ref 39.0–75.0)
NEUTROS ABS: 9.8 10*3/uL — AB (ref 1.5–6.5)
PLATELETS: 205 10*3/uL (ref 140–400)
RBC: 4.92 10*6/uL (ref 4.20–5.82)
RDW: 16.3 % — AB (ref 11.0–14.6)
WBC: 12.4 10*3/uL — AB (ref 4.0–10.3)

## 2016-08-13 MED ORDER — VINBLASTINE SULFATE CHEMO INJECTION 1 MG/ML
6.0500 mg/m2 | Freq: Once | INTRAVENOUS | Status: AC
  Administered 2016-08-13: 15 mg via INTRAVENOUS
  Filled 2016-08-13: qty 15

## 2016-08-13 MED ORDER — DOXORUBICIN HCL CHEMO IV INJECTION 2 MG/ML
25.0000 mg/m2 | Freq: Once | INTRAVENOUS | Status: AC
  Administered 2016-08-13: 62 mg via INTRAVENOUS
  Filled 2016-08-13: qty 31

## 2016-08-13 MED ORDER — SODIUM CHLORIDE 0.9 % IV SOLN
375.0000 mg/m2 | Freq: Once | INTRAVENOUS | Status: AC
  Administered 2016-08-13: 940 mg via INTRAVENOUS
  Filled 2016-08-13: qty 47

## 2016-08-13 MED ORDER — SODIUM CHLORIDE 0.9% FLUSH
10.0000 mL | INTRAVENOUS | Status: DC | PRN
  Administered 2016-08-13: 10 mL
  Filled 2016-08-13: qty 10

## 2016-08-13 MED ORDER — SODIUM CHLORIDE 0.9 % IV SOLN
Freq: Once | INTRAVENOUS | Status: AC
  Administered 2016-08-13: 14:00:00 via INTRAVENOUS

## 2016-08-13 MED ORDER — SODIUM CHLORIDE 0.9 % IV SOLN
10.0000 [IU]/m2 | Freq: Once | INTRAVENOUS | Status: AC
  Administered 2016-08-13: 25 [IU] via INTRAVENOUS
  Filled 2016-08-13: qty 8.33

## 2016-08-13 MED ORDER — PALONOSETRON HCL INJECTION 0.25 MG/5ML
0.2500 mg | Freq: Once | INTRAVENOUS | Status: AC
  Administered 2016-08-13: 0.25 mg via INTRAVENOUS

## 2016-08-13 MED ORDER — SODIUM CHLORIDE 0.9 % IV SOLN
Freq: Once | INTRAVENOUS | Status: AC
  Administered 2016-08-13: 14:00:00 via INTRAVENOUS
  Filled 2016-08-13: qty 5

## 2016-08-13 MED ORDER — HEPARIN SOD (PORK) LOCK FLUSH 100 UNIT/ML IV SOLN
500.0000 [IU] | Freq: Once | INTRAVENOUS | Status: AC | PRN
  Administered 2016-08-13: 500 [IU]
  Filled 2016-08-13: qty 5

## 2016-08-13 MED ORDER — PALONOSETRON HCL INJECTION 0.25 MG/5ML
INTRAVENOUS | Status: AC
  Filled 2016-08-13: qty 5

## 2016-08-13 NOTE — Patient Instructions (Signed)
[facility] Discharge Instructions for Patients Receiving Chemotherapy  Today you received the following chemotherapy agents:  Adriamycin, Velban, Bleomycin, and DTIC.  To help prevent nausea and vomiting after your treatment, we encourage you to take your nausea medication as directed.   If you develop nausea and vomiting that is not controlled by your nausea medication, call the clinic.   BELOW ARE SYMPTOMS THAT SHOULD BE REPORTED IMMEDIATELY:  *FEVER GREATER THAN 100.5 F  *CHILLS WITH OR WITHOUT FEVER  NAUSEA AND VOMITING THAT IS NOT CONTROLLED WITH YOUR NAUSEA MEDICATION  *UNUSUAL SHORTNESS OF BREATH  *UNUSUAL BRUISING OR BLEEDING  TENDERNESS IN MOUTH AND THROAT WITH OR WITHOUT PRESENCE OF ULCERS  *URINARY PROBLEMS  *BOWEL PROBLEMS  UNUSUAL RASH Items with * indicate a potential emergency and should be followed up as soon as possible.  Feel free to call the clinic you have any questions or concerns. The clinic phone number is [phone].  Please show the [facility] at check-in to the Emergency Department and triage nurse.  Doxorubicin injection What is this medicine? DOXORUBICIN (dox oh ROO bi sin) is a chemotherapy drug. It is used to treat many kinds of cancer like Hodgkin's disease, leukemia, non-[name], neuroblastoma, sarcoma, and Wilms' tumor. It is also used to treat bladder cancer, breast cancer, lung cancer, ovarian cancer, stomach cancer, and thyroid cancer. This medicine may be used for other purposes; ask your health care provider or pharmacist if you have questions. What should I tell my health care provider before I take this medicine? They need to know if you have any of these conditions: -blood disorders -heart disease, recent heart attack -infection (especially a virus infection such as chickenpox, cold sores, or herpes) -irregular heartbeat -liver disease -recent or ongoing radiation therapy -an unusual or allergic  reaction to doxorubicin, other chemotherapy agents, other medicines, foods, dyes, or preservatives -pregnant or trying to get pregnant -breast-feeding How should I use this medicine? This drug is given as an infusion into a vein. It is administered in a hospital or clinic by a specially trained health care professional. If you have pain, swelling, burning or any unusual feeling around the site of your injection, tell your health care professional right away. Talk to your pediatrician regarding the use of this medicine in children. Special care may be needed. Overdosage: If you think you have taken too much of this medicine contact a poison control center or emergency room at once. NOTE: This medicine is only for you. Do not share this medicine with others. What if I miss a dose? It is important not to miss your dose. Call your doctor or health care professional if you are unable to keep an appointment. What may interact with this medicine? Do not take this medicine with any of the following medications: -cisapride -droperidol -halofantrine -pimozide -zidovudine This medicine may also interact with the following medications: -chloroquine -chlorpromazine -clarithromycin -cyclophosphamide -cyclosporine -erythromycin -medicines for depression, anxiety, or psychotic disturbances -medicines for irregular heart beat like amiodarone, bepridil, dofetilide, encainide, flecainide, propafenone, quinidine -medicines for seizures like ethotoin, fosphenytoin, phenytoin -medicines for nausea, vomiting like dolasetron, ondansetron, palonosetron -medicines to increase blood counts like filgrastim, pegfilgrastim, sargramostim -methadone -methotrexate -pentamidine -progesterone -vaccines -verapamil Talk to your doctor or health care professional before taking any of these medicines: -acetaminophen -aspirin -ibuprofen -ketoprofen -naproxen This list may not describe all possible interactions.  Give your health care provider a list of all the medicines, herbs, non-prescription drugs, or dietary supplements you  use. Also tell them if you smoke, drink alcohol, or use illegal drugs. Some items may interact with your medicine. What should I watch for while using this medicine? Your condition will be monitored carefully while you are receiving this medicine. You will need important blood work done while you are taking this medicine. This drug may make you feel generally unwell. This is not uncommon, as chemotherapy can affect healthy cells as well as cancer cells. Report any side effects. Continue your course of treatment even though you feel ill unless your doctor tells you to stop. Your urine may turn red for a few days after your dose. This is not blood. If your urine is dark or brown, call your doctor. In some cases, you may be given additional medicines to help with side effects. Follow all directions for their use. Call your doctor or health care professional for advice if you get a fever, chills or sore throat, or other symptoms of a cold or flu. Do not treat yourself. This drug decreases your body's ability to fight infections. Try to avoid being around people who are sick. This medicine may increase your risk to bruise or bleed. Call your doctor or health care professional if you notice any unusual bleeding. Be careful brushing and flossing your teeth or using a toothpick because you may get an infection or bleed more easily. If you have any dental work done, tell your dentist you are receiving this medicine. Avoid taking products that contain aspirin, acetaminophen, ibuprofen, naproxen, or ketoprofen unless instructed by your doctor. These medicines may hide a fever. Men and women of childbearing age should use effective birth control methods while using taking this medicine. Do not become pregnant while taking this medicine. There is a potential for serious side effects to an unborn child.  Talk to your health care professional or pharmacist for more information. Do not breast-feed an infant while taking this medicine. Do not let others touch your urine or other body fluids for 5 days after each treatment with this medicine. Caregivers should wear latex gloves to avoid touching body fluids during this time. There is a maximum amount of this medicine you should receive throughout your life. The amount depends on the medical condition being treated and your overall health. Your doctor will watch how much of this medicine you receive in your lifetime. Tell your doctor if you have taken this medicine before. What side effects may I notice from receiving this medicine? Side effects that you should report to your doctor or health care professional as soon as possible: -allergic reactions like skin rash, itching or hives, swelling of the face, lips, or tongue -low blood counts - this medicine may decrease the number of white blood cells, red blood cells and platelets. You may be at increased risk for infections and bleeding. -signs of infection - fever or chills, cough, sore throat, pain or difficulty passing urine -signs of decreased platelets or bleeding - bruising, pinpoint red spots on the skin, black, tarry stools, blood in the urine -signs of decreased red blood cells - unusually weak or tired, fainting spells, lightheadedness -breathing problems -chest pain -fast, irregular heartbeat -mouth sores -nausea, vomiting -pain, swelling, redness at site where injected -pain, tingling, numbness in the hands or feet -swelling of ankles, feet, or hands -unusual bleeding or bruising Side effects that usually do not require medical attention (report to your doctor or health care professional if they continue or are bothersome): -diarrhea -facial flushing -hair loss -  loss of appetite -missed menstrual periods -nail discoloration or damage -red or watery eyes -red colored urine -stomach  upset This list may not describe all possible side effects. Call your doctor for medical advice about side effects. You may report side effects to FDA at 1-800-FDA-1088. Where should I keep my medicine? This drug is given in a hospital or clinic and will not be stored at home. NOTE: This sheet is a summary. It may not cover all possible information. If you have questions about this medicine, talk to your doctor, pharmacist, or health care provider.    2016, [employer] Standard. (2013-02-13 09:54:34)  Vinblastine injection What is this medicine? VINBLASTINE (vin BLAS teen) is a chemotherapy drug. It slows the growth of cancer cells. This medicine is used to treat many types of cancer like breast cancer, testicular cancer, Hodgkin's disease, non-[name], and sarcoma. This medicine may be used for other purposes; ask your health care provider or pharmacist if you have questions. What should I tell my health care provider before I take this medicine? They need to know if you have any of these conditions: -blood disorders -dental disease -gout -infection (especially a virus infection such as chickenpox, cold sores, or herpes) -liver disease -lung disease -nervous system disease -recent or ongoing radiation therapy -an unusual or allergic reaction to vinblastine, other chemotherapy agents, other medicines, foods, dyes, or preservatives -pregnant or trying to get pregnant -breast-feeding How should I use this medicine? This drug is given as an infusion into a vein. It is administered in a hospital or clinic by a specially trained health care professional. If you have pain, swelling, burning or any unusual feeling around the site of your injection, tell your health care professional right away. Talk to your pediatrician regarding the use of this medicine in children. While this drug may be prescribed for selected conditions, precautions do apply. Overdosage: If you think you have  taken too much of this medicine contact a poison control center or emergency room at once. NOTE: This medicine is only for you. Do not share this medicine with others. What if I miss a dose? It is important not to miss your dose. Call your doctor or health care professional if you are unable to keep an appointment. What may interact with this medicine? Do not take this medicine with any of the following medications: -erythromycin -itraconazole -mibefradil -voriconazole This medicine may also interact with the following medications: -cyclosporine -fluconazole -ketoconazole -medicines for seizures like phenytoin -medicines to increase blood counts like filgrastim, pegfilgrastim, sargramostim -vaccines -verapamil Talk to your doctor or health care professional before taking any of these medicines: -acetaminophen -aspirin -ibuprofen -ketoprofen -naproxen This list may not describe all possible interactions. Give your health care provider a list of all the medicines, herbs, non-prescription drugs, or dietary supplements you use. Also tell them if you smoke, drink alcohol, or use illegal drugs. Some items may interact with your medicine. What should I watch for while using this medicine? Your condition will be monitored carefully while you are receiving this medicine. You will need important blood work done while you are taking this medicine. This drug may make you feel generally unwell. This is not uncommon, as chemotherapy can affect healthy cells as well as cancer cells. Report any side effects. Continue your course of treatment even though you feel ill unless your doctor tells you to stop. In some cases, you may be given additional medicines to help with side effects. Follow all directions for their use.  Call your doctor or health care professional for advice if you get a fever, chills or sore throat, or other symptoms of a cold or flu. Do not treat yourself. This drug decreases your  body's ability to fight infections. Try to avoid being around people who are sick. This medicine may increase your risk to bruise or bleed. Call your doctor or health care professional if you notice any unusual bleeding. Be careful brushing and flossing your teeth or using a toothpick because you may get an infection or bleed more easily. If you have any dental work done, tell your dentist you are receiving this medicine. Avoid taking products that contain aspirin, acetaminophen, ibuprofen, naproxen, or ketoprofen unless instructed by your doctor. These medicines may hide a fever. Do not become pregnant while taking this medicine. Women should inform their doctor if they wish to become pregnant or think they might be pregnant. There is a potential for serious side effects to an unborn child. Talk to your health care professional or pharmacist for more information. Do not breast-feed an infant while taking this medicine. Men may have a lower sperm count while taking this medicine. Talk to your doctor if you plan to father a child. What side effects may I notice from receiving this medicine? Side effects that you should report to your doctor or health care professional as soon as possible: -allergic reactions like skin rash, itching or hives, swelling of the face, lips, or tongue -low blood counts - This drug may decrease the number of white blood cells, red blood cells and platelets. You may be at increased risk for infections and bleeding. -signs of infection - fever or chills, cough, sore throat, pain or difficulty passing urine -signs of decreased platelets or bleeding - bruising, pinpoint red spots on the skin, black, tarry stools, nosebleeds -signs of decreased red blood cells - unusually weak or tired, fainting spells, lightheadedness -breathing problems -changes in hearing -change in the amount of urine -chest pain -high blood pressure -mouth sores -nausea and vomiting -pain, swelling,  redness or irritation at the injection site -pain, tingling, numbness in the hands or feet -problems with balance, dizziness -seizures Side effects that usually do not require medical attention (report to your doctor or health care professional if they continue or are bothersome): -constipation -hair loss -jaw pain -loss of appetite -sensitivity to light -stomach pain -tumor pain This list may not describe all possible side effects. Call your doctor for medical advice about side effects. You may report side effects to FDA at 1-800-FDA-1088. Where should I keep my medicine? This drug is given in a hospital or clinic and will not be stored at home. NOTE: This sheet is a summary. It may not cover all possible information. If you have questions about this medicine, talk to your doctor, pharmacist, or health care provider.    2016, [employer]/Gold Standard. (2008-07-15 17:15:59)  Bleomycin injection What is this medicine? BLEOMYCIN (blee oh MYE sin) is a chemotherapy drug. It is used to treat many kinds of cancer like lymphoma, cervical cancer, head and neck cancer, and testicular cancer. It is also used to prevent and to treat fluid build-up around the lungs caused by some cancers. This medicine may be used for other purposes; ask your health care provider or pharmacist if you have questions. What should I tell my health care provider before I take this medicine? They need to know if you have any of these conditions: -cigarette smoker -kidney disease -lung disease -recent or  ongoing radiation therapy -an unusual or allergic reaction to bleomycin, other chemotherapy agents, other medicines, foods, dyes, or preservatives -pregnant or trying to get pregnant -breast-feeding How should I use this medicine? This drug is given as an infusion into a vein or a body cavity. It can also be given as an injection into a muscle or under the skin. It is administered in a hospital or clinic by a  specially trained health care professional. Talk to your pediatrician regarding the use of this medicine in children. Special care may be needed. Overdosage: If you think you have taken too much of this medicine contact a poison control center or emergency room at once. NOTE: This medicine is only for you. Do not share this medicine with others. What if I miss a dose? It is important not to miss your dose. Call your doctor or health care professional if you are unable to keep an appointment. What may interact with this medicine? -certain antibiotics given by injection -cisplatin -cyclosporine -diuretics -foscarnet -medicines to increase blood counts like filgrastim, pegfilgrastim, sargramostim -vaccines This list may not describe all possible interactions. Give your health care provider a list of all the medicines, herbs, non-prescription drugs, or dietary supplements you use. Also tell them if you smoke, drink alcohol, or use illegal drugs. Some items may interact with your medicine. What should I watch for while using this medicine? Visit your doctor for checks on your progress. This drug may make you feel generally unwell. This is not uncommon, as chemotherapy can affect healthy cells as well as cancer cells. Report any side effects. Continue your course of treatment even though you feel ill unless your doctor tells you to stop. Call your doctor or health care professional for advice if you get a fever, chills or sore throat, or other symptoms of a cold or flu. Do not treat yourself. This drug decreases your body's ability to fight infections. Try to avoid being around people who are sick. Avoid taking products that contain aspirin, acetaminophen, ibuprofen, naproxen, or ketoprofen unless instructed by your doctor. These medicines may hide a fever. Do not become pregnant while taking this medicine. Women should inform their doctor if they wish to become pregnant or think they might be pregnant.  There is a potential for serious side effects to an unborn child. Talk to your health care professional or pharmacist for more information. Do not breast-feed an infant while taking this medicine. There is a maximum amount of this medicine you should receive throughout your life. The amount depends on the medical condition being treated and your overall health. Your doctor will watch how much of this medicine you receive in your lifetime. Tell your doctor if you have taken this medicine before. What side effects may I notice from receiving this medicine? Side effects that you should report to your doctor or health care professional as soon as possible: -allergic reactions like skin rash, itching or hives, swelling of the face, lips, or tongue -breathing problems -chest pain -confusion -cough -fast, irregular heartbeat -feeling faint or lightheaded, falls -fever or chills -mouth sores -pain, tingling, numbness in the hands or feet -trouble passing urine or change in the amount of urine -yellowing of the eyes or skin Side effects that usually do not require medical attention (report to your doctor or health care professional if they continue or are bothersome): -darker skin color -hair loss -irritation at site where injected -loss of appetite -nail changes -nausea and vomiting -weight loss This list  may not describe all possible side effects. Call your doctor for medical advice about side effects. You may report side effects to FDA at 1-800-FDA-1088. Where should I keep my medicine? This drug is given in a hospital or clinic and will not be stored at home. NOTE: This sheet is a summary. It may not cover all possible information. If you have questions about this medicine, talk to your doctor, pharmacist, or health care provider.    2016, [employer] Standard. (2013-02-13 09:36:48)  Dacarbazine, DTIC injection What is this medicine? DACARBAZINE (da KAR ba zeen) is a chemotherapy drug.  This medicine is used to treat skin cancer. It is also used with other medicines to treat Hodgkin's disease. This medicine may be used for other purposes; ask your health care provider or pharmacist if you have questions. What should I tell my health care provider before I take this medicine? They need to know if you have any of these conditions: -infection (especially virus infection such as chickenpox, cold sores, or herpes) -kidney disease -liver disease -low blood counts like low platelets, red blood cells, white blood cells -recent radiation therapy -an unusual or allergic reaction to dacarbazine, other chemotherapy agents, other medicines, foods, dyes, or preservatives -pregnant or trying to get pregnant -breast-feeding How should I use this medicine? This drug is given as an injection or infusion into a vein. It is administered in a hospital or clinic by a specially trained health care professional. Talk to your pediatrician regarding the use of this medicine in children. While this drug may be prescribed for selected conditions, precautions do apply. Overdosage: If you think you have taken too much of this medicine contact a poison control center or emergency room at once. NOTE: This medicine is only for you. Do not share this medicine with others. What if I miss a dose? It is important not to miss your dose. Call your doctor or health care professional if you are unable to keep an appointment. What may interact with this medicine? -medicines to increase blood counts like filgrastim, pegfilgrastim, sargramostim -vaccines This list may not describe all possible interactions. Give your health care provider a list of all the medicines, herbs, non-prescription drugs, or dietary supplements you use. Also tell them if you smoke, drink alcohol, or use illegal drugs. Some items may interact with your medicine. What should I watch for while using this medicine? Your condition will be monitored  carefully while you are receiving this medicine. You will need important blood work done while you are taking this medicine. This drug may make you feel generally unwell. This is not uncommon, as chemotherapy can affect healthy cells as well as cancer cells. Report any side effects. Continue your course of treatment even though you feel ill unless your doctor tells you to stop. Call your doctor or health care professional for advice if you get a fever, chills or sore throat, or other symptoms of a cold or flu. Do not treat yourself. This drug decreases your body's ability to fight infections. Try to avoid being around people who are sick. This medicine may increase your risk to bruise or bleed. Call your doctor or health care professional if you notice any unusual bleeding. Be careful brushing and flossing your teeth or using a toothpick because you may get an infection or bleed more easily. If you have any dental work done, tell your dentist you are receiving this medicine. Avoid taking products that contain aspirin, acetaminophen, ibuprofen, naproxen, or ketoprofen unless instructed  by your doctor. These medicines may hide a fever. Do not become pregnant while taking this medicine. Women should inform their doctor if they wish to become pregnant or think they might be pregnant. There is a potential for serious side effects to an unborn child. Talk to your health care professional or pharmacist for more information. Do not breast-feed an infant while taking this medicine. What side effects may I notice from receiving this medicine? Side effects that you should report to your doctor or health care professional as soon as possible: -allergic reactions like skin rash, itching or hives, swelling of the face, lips, or tongue -low blood counts - this medicine may decrease the number of white blood cells, red blood cells and platelets. You may be at increased risk for infections and bleeding. -signs of  infection - fever or chills, cough, sore throat, pain or difficulty passing urine -signs of decreased platelets or bleeding - bruising, pinpoint red spots on the skin, black, tarry stools, blood in the urine -signs of decreased red blood cells - unusually weak or tired, fainting spells, lightheadedness -breathing problems -muscle pains -pain at site where injected -trouble passing urine or change in the amount of urine -vomiting -yellowing of the eyes or skin Side effects that usually do not require medical attention (report to your doctor or health care professional if they continue or are bothersome): -diarrhea -hair loss -loss of appetite -nausea -skin more sensitive to sun or ultraviolet light -stomach upset This list may not describe all possible side effects. Call your doctor for medical advice about side effects. You may report side effects to FDA at 1-800-FDA-1088. Where should I keep my medicine? This drug is given in a hospital or clinic and will not be stored at home. NOTE: This sheet is a summary. It may not cover all possible information. If you have questions about this medicine, talk to your doctor, pharmacist, or health care provider.    2016, Elsevier/Gold Standard. (2008-02-06 16:56:39)

## 2016-08-20 ENCOUNTER — Ambulatory Visit: Payer: Commercial Managed Care - HMO

## 2016-08-23 NOTE — Progress Notes (Signed)
[name]    HEMATOLOGY/ONCOLOGY CLINIC NOTE  Date of Service: .08/13/2016  Patient Care Team: No Pcp Per Patient as PCP - General (General Practice)  CHIEF COMPLAINTS/PURPOSE OF CONSULTATION:  Newly diagnosed classical [name]   HISTORY OF PRESENTING ILLNESS:   Craig Schmitt is a wonderful 30 y.o. male who has been referred to [place] by Dr .Jackolyn Confer MD for evaluation and management of newly diagnosed classical [name].  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 The lymph nodes in his neck were progressively growing and he noted some enlarged lymph nodes under his left armpit  and therefore sought additional attention.   He had a CT of the neck  on 05/24/2016 that showed multiple enlarged lymph nodes in the left neck concerning for possible lymphoma. No pharyngeal masses were noted. CT of the chest/Abd done the same day showed left posterior cervical, supraclavicular, left axillary, mediastinal and right hilar lymphadenopathy highly suspicious for lymphoma. No findings suspicious for lymphoma beneath the diaphragm. Spleen was normal in size.  Patient was seen by Dr. Jackolyn Confer had a left cervical lymph node biopsy on 07/20/2016 that showed classical [name], nodular sclerosis type.  Patient reports no fevers no chills no night sweats no weight loss no fatigue. No change in his breathing. No chest pain. No abdominal pain or discomfort. No change in bowel habits. No headaches or focal neurological deficits.  He is here for his clinic visit with his brother for discussion of his diagnosis. He was not aware of the final pathologic diagnosis till we discussed it in clinic today.   INTERVAL HISTORY  Craig Schmitt is here for followup prior to starting his 1 cycle of ABVD. He had his port placed. He  had his chemotherapy counseling. No other acute new symptoms.  MEDICAL HISTORY:  1)Hives to pollen  SURGICAL HISTORY: Past Surgical History:  Procedure Laterality Date  . IR GENERIC HISTORICAL  08/02/2016   IR FLUORO GUIDE PORT INSERTION RIGHT 08/02/2016 [name], MD [facility]  . IR GENERIC HISTORICAL  08/02/2016   IR US GUIDE VASC ACCESS RIGHT 08/02/2016 [name], MD [facility]  . KNEE ARTHROSCOPY    . LYMPH GLAND EXCISION Left 07/20/2016   Procedure: LEFT CERVICAL LYMPH NODE BIOPSY;  Surgeon: Jackolyn Confer, MD;  Location: [facility];  Service: General;  Laterality: Left;    SOCIAL HISTORY: Social History   Social History  . Marital status: Single    Spouse name: N/A  . Number of children: N/A  . Years of education: N/A   Occupational History  . Not on file.   Social History Main Topics  . Smoking status: Never Smoker  . Smokeless tobacco: Never Used  . Alcohol use No  . Drug use: No  . Sexual activity: Not on file   Other Topics Concern  . Not on file   Social History Narrative  . No narrative on file  Never smoker   no significant alcohol use  FAMILY HISTORY: family history of hypertension  No known family history of blood disorders or cancer .  ALLERGIES:  has No Known Allergies.  Allergic to pollen no other known drug or food allergies .  MEDICATIONS:  Current Outpatient Prescriptions  Medication Sig Dispense Refill  . dexamethasone (DECADRON) 4 MG tablet Take 2 tablets by mouth once a day  on the day after chemotherapy and then take 2 tablets two times a day for 2 days. Take with food. 30 tablet 1  . ibuprofen (ADVIL,MOTRIN) 200 MG tablet Take 200 mg by mouth every 6 (six) hours as needed for mild pain.    [name] lidocaine-prilocaine (EMLA) cream Apply to affected area once 30 g 3  . ondansetron (ZOFRAN) 8 MG tablet Take 1 tablet (8 mg total) by mouth 2 (two) times daily as needed. Start on the third day after chemotherapy. 30  tablet 1  . prochlorperazine (COMPAZINE) 10 MG tablet Take 1 tablet (10 mg total) by mouth every 6 (six) hours as needed (Nausea or vomiting). 30 tablet 1   No current facility-administered medications for this visit.     REVIEW OF SYSTEMS:    10 Point review of Systems was done is negative except as noted above.  PHYSICAL EXAMINATION: ECOG PERFORMANCE STATUS: 0 - Asymptomatic  . Vitals:   08/13/16 1100  BP: (!) 145/85  Pulse: 81  Resp: 18  Temp: 97.9 F (36.6 C)   Filed Weights   08/13/16 1100  Weight: 265 lb 9.6 oz (120.5 kg)   .Body mass index is 35.04 kg/m.  GENERAL:alert, well-built African-American gentleman in no acute distress and comfortable SKIN: skin color, texture, turgor are normal, no rashes or significant lesions EYES: normal, conjunctiva are pink and non-injected, sclera clear OROPHARYNX:no exudate, no erythema and lips, buccal mucosa, and tongue normal  NECK: supple, no JVD, thyroid normal size, non-tender, without nodularity LYMPH:  Palpable left cervical , left supraclavicular and left axillary lymph nodes .no palpable inguinal lymph nodes.  LUNGS: clear to auscultation with normal respiratory effort HEART: regular rate & rhythm,  no murmurs and no lower extremity edema ABDOMEN: abdomen soft, non-tender, normoactive bowel sounds , no palpable hepatosplenomegaly . Musculoskeletal: no cyanosis of digits and no clubbing  PSYCH: alert & oriented x 3 with fluent speech NEURO: no focal motor/sensory deficits  LABORATORY DATA:  I have reviewed the data as listed  . CBC Latest Ref Rng & Units 08/13/2016 08/02/2016 07/27/2016  WBC 4.0 - 10.3 10e3/uL 12.4(H) 12.7(H) 15.5(H)  Hemoglobin 13.0 - 17.1 g/dL 12.9(L) 13.4 13.8  Hematocrit 38.4 - 49.9 % 40.4 39.5 42.9  Platelets 140 - 400 10e3/uL 205 283 259    . CMP Latest Ref Rng & Units 08/13/2016 07/27/2016 05/18/2016  Glucose 70 - 140 mg/dl 91 84 89  BUN 7.0 - 26.0 mg/dL 11.7 15.2 14  Creatinine 0.7 - 1.3  mg/dL 1.1 1.1 1.04  Sodium 136 - 145 mEq/L 138 140 137  Potassium 3.5 - 5.1 mEq/L 3.9 4.2 4.1  Chloride 101 - 111 mmol/L - - 107  CO2 22 - 29 mEq/L 25 24 26   Calcium 8.4 - 10.4 mg/dL 9.1 10.0 8.9  Total Protein 6.4 - 8.3 g/dL 8.2 8.6(H) 7.6  Total Bilirubin 0.20 - 1.20 mg/dL 0.40 0.35 0.2(L)  Alkaline Phos 40 - 150 U/L 91 96 71  AST 5 - 34 U/L 15 16 16   ALT 0 - 55 U/L 15 18 16(L)   Component     Latest Ref Rng & Units 07/27/2016  Sed Rate     0 - 15 mm/hr 43 (H)  LDH     125 - 245 U/L 157  Hep C Virus Ab     0.0 - 0.9 s/co ratio <0.1  Hepatitis B Surface Ag     Negative Negative      RADIOGRAPHIC STUDIES: I have personally  reviewed the radiological images as listed and agreed with the findings in the report.  .Nm Pet Image Initial (pi) Skull Base To Thigh  Result Date: 08/12/2016 CLINICAL DATA:  Initial treatment strategy for [name]. Staging examination. EXAM: NUCLEAR MEDICINE PET SKULL BASE TO THIGH TECHNIQUE: 13.02 mCi F-18 FDG was injected intravenously. Full-ring PET imaging was performed from the skull base to thigh after the radiotracer. CT data was obtained and used for attenuation correction and anatomic localization. FASTING BLOOD GLUCOSE:  Value: 101 mg/dl COMPARISON:  CT of the chest, abdomen and pelvis 05/24/2016. FINDINGS: NECK Extensive bulky lymphadenopathy is noted throughout all left-sided cervical nodal regions, demonstrating extensive hypermetabolism at all levels up to an SUV max of 26.9. The bulkiest hypermetabolic (SUVmax 123XX123) node or conglomeration of lymph nodes is in the left posterior triangle (level 5B) measuring up to 4.5 x 4.1 cm (image 44 of series 4). CHEST There is extensive multifocal hypermetabolic lymphadenopathy the throughout the mediastinum right hilar nodal station, left supraclavicular nodal station and left axillary and subpectoral regions. The largest node or conglomeration of nodes is in the left subpectoral region measuring up to  3.5 cm in short axis (image 55 of series 4), markedly hypermetabolic (SUVmax = [id number]). In addition, in the medial aspect of the right upper lobe abutting the pleura near the apex (image 15 of series 7) there is a hypermetabolic (SUVmax = 0000000 mm pulmonary nodule. No acute consolidative airspace disease. No pleural effusions. Mild cardiomegaly. Right internal jugular single-lumen porta cath with tip terminating in the superior aspect of the right atrium. ABDOMEN/PELVIS No abnormal hypermetabolic activity within the liver, pancreas, adrenal glands, or spleen. Spleen is normal in size. No hypermetabolic lymph nodes in the abdomen or pelvis. No significant volume of ascites. No pneumoperitoneum. No pathologic dilatation of small bowel or colon. SKELETON Relatively diffuse hypermetabolism noted throughout the visualized axial and appendicular skeleton, without discrete aggressive appearing osseous lesions on the CT portion of the examination. IMPRESSION: 1. Extensive hypermetabolic lymphadenopathy involving the left cervical nodal, left supraclavicular, left axillary and subpectoral, mediastinal and right hilar nodal stations, as well as a solitary pulmonary nodule in the medial aspect of the right upper lobe which is hypermetabolic. No infradiaphragmatic disease noted. 2. Spleen is normal in size and does not appear hypermetabolic. 3. Relatively diffuse hypermetabolism throughout the visualized axial and appendicular skeleton could indicate marrow involvement, or could be secondary to stimulation therapy. Clinical correlation is recommended. Electronically Signed   By: Vinnie Langton M.D.   On: 08/12/2016 10:30   Ir US Guide Vasc Access Right  Result Date: 08/02/2016 CLINICAL DATA:  Non-[name] and need for porta cath to begin chemotherapy. EXAM: IMPLANTED PORT A CATH PLACEMENT WITH ULTRASOUND AND FLUOROSCOPIC GUIDANCE ANESTHESIA/SEDATION: 3.0 mg IV Versed; 150 mcg IV Fentanyl Total Moderate Sedation  Time:  40 minutes The patient's level of consciousness and physiologic status were continuously monitored during the procedure by Radiology nursing. Additional Medications: 2 g IV Ancef. As antibiotic prophylaxis, Ancef was ordered pre-procedure and administered intravenously within one hour of incision. FLUOROSCOPY TIME:  36 seconds, 15 mGy. PROCEDURE: The procedure, risks, benefits, and alternatives were explained to the patient. Questions regarding the procedure were encouraged and answered. The patient understands and consents to the procedure. A time-out was performed prior to initiating the procedure. Ultrasound was utilized to confirm patency of the right internal jugular vein. The right neck and chest were prepped with chlorhexidine in a sterile fashion, and a sterile drape was applied  covering the operative field. Maximum barrier sterile technique with sterile gowns and gloves were used for the procedure. Local anesthesia was provided with 1% lidocaine. After creating a small venotomy incision, a 21 gauge needle was advanced into the right internal jugular vein under direct, real-time ultrasound guidance. Ultrasound image documentation was performed. After securing guidewire access, an 8 Fr dilator was placed. A J-wire was kinked to measure appropriate catheter length. A subcutaneous port pocket was then created along the upper chest wall utilizing sharp and blunt dissection. Portable cautery was utilized. The pocket was irrigated with sterile saline. A single lumen power injectable port was chosen for placement. The 8 Fr catheter was tunneled from the port pocket site to the venotomy incision. The port was placed in the pocket. External catheter was trimmed to appropriate length based on guidewire measurement. At the venotomy, an 8 Fr peel-away sheath was placed over a guidewire. The catheter was then placed through the sheath and the sheath removed. Final catheter positioning was confirmed and documented  with a fluoroscopic spot image. The port was accessed with a needle and aspirated and flushed with heparinized saline. The access needle was removed. The venotomy and port pocket incisions were closed with subcutaneous 3-0 Monocryl and subcuticular 4-0 Vicryl. Dermabond was applied to both incisions. COMPLICATIONS: COMPLICATIONS None FINDINGS: After catheter placement, the tip lies at the cavo-atrial junction. The catheter aspirates normally and is ready for immediate use. IMPRESSION: Placement of single lumen port a cath via right internal jugular vein. The catheter tip lies at the cavo-atrial junction. A power injectable port a cath was placed and is ready for immediate use. Electronically Signed   By: [name] M.D.   On: 08/02/2016 11:26   Ir Fluoro Guide Port Insertion Right  Result Date: 08/02/2016 CLINICAL DATA:  Non-[name] and need for porta cath to begin chemotherapy. EXAM: IMPLANTED PORT A CATH PLACEMENT WITH ULTRASOUND AND FLUOROSCOPIC GUIDANCE ANESTHESIA/SEDATION: 3.0 mg IV Versed; 150 mcg IV Fentanyl Total Moderate Sedation Time:  40 minutes The patient's level of consciousness and physiologic status were continuously monitored during the procedure by Radiology nursing. Additional Medications: 2 g IV Ancef. As antibiotic prophylaxis, Ancef was ordered pre-procedure and administered intravenously within one hour of incision. FLUOROSCOPY TIME:  36 seconds, 15 mGy. PROCEDURE: The procedure, risks, benefits, and alternatives were explained to the patient. Questions regarding the procedure were encouraged and answered. The patient understands and consents to the procedure. A time-out was performed prior to initiating the procedure. Ultrasound was utilized to confirm patency of the right internal jugular vein. The right neck and chest were prepped with chlorhexidine in a sterile fashion, and a sterile drape was applied covering the operative field. Maximum barrier sterile technique with  sterile gowns and gloves were used for the procedure. Local anesthesia was provided with 1% lidocaine. After creating a small venotomy incision, a 21 gauge needle was advanced into the right internal jugular vein under direct, real-time ultrasound guidance. Ultrasound image documentation was performed. After securing guidewire access, an 8 Fr dilator was placed. A J-wire was kinked to measure appropriate catheter length. A subcutaneous port pocket was then created along the upper chest wall utilizing sharp and blunt dissection. Portable cautery was utilized. The pocket was irrigated with sterile saline. A single lumen power injectable port was chosen for placement. The 8 Fr catheter was tunneled from the port pocket site to the venotomy incision. The port was placed in the pocket. External catheter was trimmed to appropriate length  based on guidewire measurement. At the venotomy, an 8 Fr peel-away sheath was placed over a guidewire. The catheter was then placed through the sheath and the sheath removed. Final catheter positioning was confirmed and documented with a fluoroscopic spot image. The port was accessed with a needle and aspirated and flushed with heparinized saline. The access needle was removed. The venotomy and port pocket incisions were closed with subcutaneous 3-0 Monocryl and subcuticular 4-0 Vicryl. Dermabond was applied to both incisions. COMPLICATIONS: COMPLICATIONS None FINDINGS: After catheter placement, the tip lies at the cavo-atrial junction. The catheter aspirates normally and is ready for immediate use. IMPRESSION: Placement of single lumen port a cath via right internal jugular vein. The catheter tip lies at the cavo-atrial junction. A power injectable port a cath was placed and is ready for immediate use. Electronically Signed   By: [name] M.D.   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 Stage IVA as per PET/CT scan suggestive of bone marrow involvement. Sedimentation rate 43 ECHO nl EF PFTs WNL Port placed Chemi-counseling done and consent obtained. Plan  -PET/CT results and images were reviewed with patient. -his questions from all the reading material her was provided about HL were answered. -he feels ready to proceed with his C1 of ABVD treatment.  RTC in 2 weeks with Dr Irene Limbo for toxicity check with labs on C1D15 . Earlier if any new concerns.  All of the patients and his brothers questions were answered to their apparent satisfaction. The patient knows to call the clinic with any problems, questions or concerns.  I spent 25 minutes counseling the patient face to face. The total time spent in the appointment was 25 minutes and more than 50% was on counseling and direct patient cares.    Sullivan Lone MD [place] AAHIVMS [facility] Hematology/Oncology Physician [facility]  (Office):       [phone] (Work cell):  [phone] (Fax):           [phone]

## 2016-08-25 ENCOUNTER — Telehealth: Payer: Self-pay | Admitting: Hematology

## 2016-08-25 NOTE — Telephone Encounter (Signed)
LEFT MESSAGE FOR PATIENT RE NEXT APPOINTMENT 10/27

## 2016-08-25 NOTE — Telephone Encounter (Signed)
°  °  LEFT MESSAGE FOR PATIENT RE NEXT APPOINTMENT 10/27. DUE GK OUT F/U MOVED TO CB PER GK - OK PER CB.

## 2016-08-27 ENCOUNTER — Encounter: Payer: Self-pay | Admitting: Nurse Practitioner

## 2016-08-27 ENCOUNTER — Other Ambulatory Visit (HOSPITAL_BASED_OUTPATIENT_CLINIC_OR_DEPARTMENT_OTHER): Payer: Commercial Managed Care - HMO

## 2016-08-27 ENCOUNTER — Ambulatory Visit (HOSPITAL_BASED_OUTPATIENT_CLINIC_OR_DEPARTMENT_OTHER): Payer: Commercial Managed Care - HMO

## 2016-08-27 ENCOUNTER — Ambulatory Visit (HOSPITAL_BASED_OUTPATIENT_CLINIC_OR_DEPARTMENT_OTHER): Payer: Commercial Managed Care - HMO | Admitting: Nurse Practitioner

## 2016-08-27 ENCOUNTER — Other Ambulatory Visit: Payer: Self-pay | Admitting: Nurse Practitioner

## 2016-08-27 VITALS — BP 144/72 | HR 60 | Temp 97.8°F | Resp 18 | Ht 73.0 in | Wt 275.9 lb

## 2016-08-27 DIAGNOSIS — C8190 Hodgkin lymphoma, unspecified, unspecified site: Secondary | ICD-10-CM | POA: Diagnosis not present

## 2016-08-27 DIAGNOSIS — R740 Nonspecific elevation of levels of transaminase and lactic acid dehydrogenase [LDH]: Secondary | ICD-10-CM | POA: Diagnosis not present

## 2016-08-27 DIAGNOSIS — C8101 Nodular lymphocyte predominant Hodgkin lymphoma, lymph nodes of head, face, and neck: Secondary | ICD-10-CM

## 2016-08-27 DIAGNOSIS — R7401 Elevation of levels of liver transaminase levels: Secondary | ICD-10-CM

## 2016-08-27 DIAGNOSIS — C8118 Nodular sclerosis classical Hodgkin lymphoma, lymph nodes of multiple sites: Secondary | ICD-10-CM

## 2016-08-27 LAB — COMPREHENSIVE METABOLIC PANEL
ALBUMIN: 3.6 g/dL (ref 3.5–5.0)
ALK PHOS: 75 U/L (ref 40–150)
ALT: 133 U/L — ABNORMAL HIGH (ref 0–55)
ANION GAP: 8 meq/L (ref 3–11)
AST: 222 U/L (ref 5–34)
BILIRUBIN TOTAL: 0.41 mg/dL (ref 0.20–1.20)
BUN: 12.9 mg/dL (ref 7.0–26.0)
CO2: 27 mEq/L (ref 22–29)
Calcium: 9.1 mg/dL (ref 8.4–10.4)
Chloride: 107 mEq/L (ref 98–109)
Creatinine: 1.1 mg/dL (ref 0.7–1.3)
Glucose: 92 mg/dl (ref 70–140)
Potassium: 3.9 mEq/L (ref 3.5–5.1)
Sodium: 142 mEq/L (ref 136–145)
TOTAL PROTEIN: 7.5 g/dL (ref 6.4–8.3)

## 2016-08-27 LAB — CBC WITH DIFFERENTIAL/PLATELET
BASO%: 1 % (ref 0.0–2.0)
Basophils Absolute: 0.1 10*3/uL (ref 0.0–0.1)
EOS ABS: 0.5 10*3/uL (ref 0.0–0.5)
EOS%: 9.1 % — ABNORMAL HIGH (ref 0.0–7.0)
HCT: 40.6 % (ref 38.4–49.9)
HEMOGLOBIN: 13 g/dL (ref 13.0–17.1)
LYMPH%: 31.3 % (ref 14.0–49.0)
MCH: 27 pg — ABNORMAL LOW (ref 27.2–33.4)
MCHC: 32 g/dL (ref 32.0–36.0)
MCV: 84.3 fL (ref 79.3–98.0)
MONO#: 0.7 10*3/uL (ref 0.1–0.9)
MONO%: 13.8 % (ref 0.0–14.0)
NEUT%: 44.8 % (ref 39.0–75.0)
NEUTROS ABS: 2.3 10*3/uL (ref 1.5–6.5)
PLATELETS: 194 10*3/uL (ref 140–400)
RBC: 4.82 10*6/uL (ref 4.20–5.82)
RDW: 16.3 % — AB (ref 11.0–14.6)
WBC: 5.1 10*3/uL (ref 4.0–10.3)
lymph#: 1.6 10*3/uL (ref 0.9–3.3)

## 2016-08-27 NOTE — Assessment & Plan Note (Addendum)
Patient received cycle 1, day 1 of his ABVD chemotherapy regimen on 08/13/2016.  He presented to the [facility] today to receive cycle 1, day 15 of the same regimen; will was found to have elevated liver enzymes.  See further notes for details of today's visit.  Patient states that he experienced some mild nausea last week; but had no other symptoms secondary to the chemotherapy.  Patient states that he has noticed some decrease in the size of the lymph nodes to both the left neck in the left axilla region after the first cycle of chemotherapy last week.  Patient will be rescheduled for labs, visit, and his next cycle of chemotherapy on 09/10/2016.

## 2016-08-27 NOTE — Progress Notes (Signed)
Established visit  HPI:  Craig Schmitt 30 y.o. male diagnosed with [name].  Currently undergoing ABVD chemotherapy regimen.    No history exists.    Review of Systems  All other systems reviewed and are negative.   Past Medical History:  Diagnosis Date  . Cancer ([place])    hodgkins lymphoma-Sept.2017    Past Surgical History:  Procedure Laterality Date  . IR GENERIC HISTORICAL  08/02/2016   IR FLUORO GUIDE PORT INSERTION RIGHT 08/02/2016 [name], MD [facility]  . IR GENERIC HISTORICAL  08/02/2016   IR US GUIDE VASC ACCESS RIGHT 08/02/2016 [name], MD [facility]  . KNEE ARTHROSCOPY    . LYMPH GLAND EXCISION Left 07/20/2016   Procedure: LEFT CERVICAL LYMPH NODE BIOPSY;  Surgeon: Jackolyn Confer, MD;  Location: [facility];  Service: General;  Laterality: Left;    has [name] ([place]) and Transaminitis on his problem list.    has No Known Allergies.    Medication List       Accurate as of 08/27/16 11:10 AM. Always use your most recent med list.          dexamethasone 4 MG tablet Commonly known as:  DECADRON Take 2 tablets by mouth once a day on the day after chemotherapy and then take 2 tablets two times a day for 2 days. Take with food.   ibuprofen 200 MG tablet Commonly known as:  ADVIL,MOTRIN Take 200 mg by mouth every 6 (six) hours as needed for mild pain.   lidocaine-prilocaine cream Commonly known as:  EMLA Apply to affected area once   ondansetron 8 MG tablet Commonly known as:  ZOFRAN Take 1 tablet (8 mg total) by mouth 2 (two) times daily as needed. Start on the third day after chemotherapy.   prochlorperazine 10 MG tablet Commonly known as:  COMPAZINE Take 1 tablet (10 mg total) by mouth every 6 (six) hours as needed (Nausea or vomiting).        PHYSICAL EXAMINATION  Oncology Vitals 08/27/2016 08/27/2016  Height - 185 cm  Weight - 125.147 kg  Weight (lbs) - 275 lbs 14 oz  BMI (kg/m2) -  36.4 kg/m2  Temp - 97.8  Pulse 60 51  Resp - 18  SpO2 - 96  BSA (m2) - 2.54 m2   BP Readings from Last 2 Encounters:  08/27/16 (!) 144/72  08/13/16 (!) 145/85    Physical Exam  Constitutional: He is oriented to person, place, and time and well-developed, well-nourished, and in no distress.  HENT:  Head: Normocephalic and atraumatic.  Mouth/Throat: Oropharynx is clear and moist.  Eyes: Conjunctivae and EOM are normal. Pupils are equal, round, and reactive to light. Right eye exhibits no discharge. Left eye exhibits no discharge. No scleral icterus.  Neck: Normal range of motion.  Pulmonary/Chest: Effort normal. No respiratory distress.  Musculoskeletal: Normal range of motion.  Neurological: He is alert and oriented to person, place, and time. Gait normal.  Skin: Skin is warm and dry.  Psychiatric: Affect normal.  Nursing note and vitals reviewed.   LABORATORY DATA:. Appointment on 08/27/2016  Component Date Value Ref Range Status  . WBC 08/27/2016 5.1  4.0 - 10.3 10e3/uL Final  . NEUT# 08/27/2016 2.3  1.5 - 6.5 10e3/uL Final  . HGB 08/27/2016 13.0  13.0 - 17.1 g/dL Final  . HCT 08/27/2016 40.6  38.4 - 49.9 % Final  . Platelets 08/27/2016 194  140 - 400 10e3/uL Final  .  MCV 08/27/2016 84.3  79.3 - 98.0 fL Final  . MCH 08/27/2016 27.0* 27.2 - 33.4 pg Final  . MCHC 08/27/2016 32.0  32.0 - 36.0 g/dL Final  . RBC 08/27/2016 4.82  4.20 - 5.82 10e6/uL Final  . RDW 08/27/2016 16.3* 11.0 - 14.6 % Final  . lymph# 08/27/2016 1.6  0.9 - 3.3 10e3/uL Final  . MONO# 08/27/2016 0.7  0.1 - 0.9 10e3/uL Final  . Eosinophils Absolute 08/27/2016 0.5  0.0 - 0.5 10e3/uL Final  . Basophils Absolute 08/27/2016 0.1  0.0 - 0.1 10e3/uL Final  . NEUT% 08/27/2016 44.8  39.0 - 75.0 % Final  . LYMPH% 08/27/2016 31.3  14.0 - 49.0 % Final  . MONO% 08/27/2016 13.8  0.0 - 14.0 % Final  . EOS% 08/27/2016 9.1* 0.0 - 7.0 % Final  . BASO% 08/27/2016 1.0  0.0 - 2.0 % Final  . Sodium 08/27/2016 142  136 - 145  mEq/L Final  . Potassium 08/27/2016 3.9  3.5 - 5.1 mEq/L Final  . Chloride 08/27/2016 107  98 - 109 mEq/L Final  . CO2 08/27/2016 27  22 - 29 mEq/L Final  . Glucose 08/27/2016 92  70 - 140 mg/dl Final  . BUN 08/27/2016 12.9  7.0 - 26.0 mg/dL Final  . Creatinine 08/27/2016 1.1  0.7 - 1.3 mg/dL Final  . Total Bilirubin 08/27/2016 0.41  0.20 - 1.20 mg/dL Final  . Alkaline Phosphatase 08/27/2016 75  40 - 150 U/L Final  . AST 08/27/2016 222* 5 - 34 U/L Final  . ALT 08/27/2016 133* 0 - 55 U/L Final  . Total Protein 08/27/2016 7.5  6.4 - 8.3 g/dL Final  . Albumin 08/27/2016 3.6  3.5 - 5.0 g/dL Final  . Calcium 08/27/2016 9.1  8.4 - 10.4 mg/dL Final  . Anion Gap 08/27/2016 8  3 - 11 mEq/L Final  . EGFR 08/27/2016 >90  >90 ml/min/1.73 m2 Final    RADIOGRAPHIC STUDIES: No results found.  ASSESSMENT/PLAN:    Transaminitis Patient received his first cycle of ABVD chemotherapy regimen on 08/13/2016.  He presented to the [facility] today to receive cycle 1, day 15 of the same chemotherapy regimen.  However, patient's liver enzymes were greatly elevated today.  AST has increased from 15 up to 222.  ALT has increased from 15 up to 133.  Most likely, elevated liver enzymes are secondary to patient's chemotherapy.  Blood counts were all within normal limits.  Patient had no other symptoms whatsoever.  Reviewed all findings with Dr. Alvy Bimler on call physician; and she recommended holding the chemotherapy today.  Dr. Alvy Bimler suggested that we reschedule chemotherapy for next week; but patient will be leaving to go out of town on Friday, 09/03/2016.  Instead-he would like to return for labs, follow up visit, and his next cycle of chemotherapy on Friday, 09/10/2016.  [name] ([place]) Patient received cycle 1, day 1 of his ABVD chemotherapy regimen on 08/13/2016.  He presented to the [facility] today to receive cycle 1, day 15 of the same regimen; will was found to have elevated liver enzymes.   See further notes for details of today's visit.  Patient states that he experienced some mild nausea last week; but had no other symptoms secondary to the chemotherapy.  Patient states that he has noticed some decrease in the size of the lymph nodes to both the left neck in the left axilla region after the first cycle of chemotherapy last week.  Patient will be rescheduled for  labs, visit, and his next cycle of chemotherapy on 09/10/2016.   Patient stated understanding of all instructions; and was in agreement with this plan of care. The patient knows to call the clinic with any problems, questions or concerns.   Total time spent with patient was 25 minutes;  with greater than 75 percent of that time spent in face to face counseling regarding patient's symptoms,  and coordination of care and follow up.  Disclaimer:This dictation was prepared with Dragon/digital dictation along with [employer]. Any transcriptional errors that result from this process are unintentional.  Drue Second, NP 08/27/2016

## 2016-08-27 NOTE — Assessment & Plan Note (Addendum)
Patient received his first cycle of ABVD chemotherapy regimen on 08/13/2016.  He presented to the [facility] today to receive cycle 1, day 15 of the same chemotherapy regimen.  However, patient's liver enzymes were greatly elevated today.  AST has increased from 15 up to 222.  ALT has increased from 15 up to 133.  Most likely, elevated liver enzymes are secondary to patient's chemotherapy.  Blood counts were all within normal limits.  Patient had no other symptoms whatsoever.  Reviewed all findings with Dr. Alvy Bimler on call physician; and she recommended holding the chemotherapy today.  Dr. Alvy Bimler suggested that we reschedule chemotherapy for next week; but patient will be leaving to go out of town on Friday, 09/03/2016.  Instead-he would like to return for labs, follow up visit, and his next cycle of chemotherapy on Friday, 09/10/2016.

## 2016-09-10 ENCOUNTER — Ambulatory Visit (HOSPITAL_BASED_OUTPATIENT_CLINIC_OR_DEPARTMENT_OTHER): Payer: Commercial Managed Care - HMO | Admitting: Hematology

## 2016-09-10 ENCOUNTER — Encounter: Payer: Self-pay | Admitting: Hematology

## 2016-09-10 ENCOUNTER — Ambulatory Visit (HOSPITAL_BASED_OUTPATIENT_CLINIC_OR_DEPARTMENT_OTHER): Payer: Commercial Managed Care - HMO

## 2016-09-10 ENCOUNTER — Other Ambulatory Visit (HOSPITAL_BASED_OUTPATIENT_CLINIC_OR_DEPARTMENT_OTHER): Payer: Commercial Managed Care - HMO

## 2016-09-10 VITALS — BP 120/76 | HR 58 | Temp 98.0°F | Resp 18 | Wt 271.7 lb

## 2016-09-10 DIAGNOSIS — C8118 Nodular sclerosis classical Hodgkin lymphoma, lymph nodes of multiple sites: Secondary | ICD-10-CM | POA: Diagnosis not present

## 2016-09-10 DIAGNOSIS — R7401 Elevation of levels of liver transaminase levels: Secondary | ICD-10-CM

## 2016-09-10 DIAGNOSIS — Z5111 Encounter for antineoplastic chemotherapy: Secondary | ICD-10-CM

## 2016-09-10 DIAGNOSIS — R740 Nonspecific elevation of levels of transaminase and lactic acid dehydrogenase [LDH]: Secondary | ICD-10-CM | POA: Diagnosis not present

## 2016-09-10 LAB — COMPREHENSIVE METABOLIC PANEL
ALBUMIN: 3.9 g/dL (ref 3.5–5.0)
ALK PHOS: 84 U/L (ref 40–150)
ALT: 40 U/L (ref 0–55)
ANION GAP: 10 meq/L (ref 3–11)
AST: 25 U/L (ref 5–34)
BILIRUBIN TOTAL: 0.39 mg/dL (ref 0.20–1.20)
BUN: 14.9 mg/dL (ref 7.0–26.0)
CALCIUM: 9.7 mg/dL (ref 8.4–10.4)
CO2: 27 mEq/L (ref 22–29)
CREATININE: 1.1 mg/dL (ref 0.7–1.3)
Chloride: 105 mEq/L (ref 98–109)
EGFR: >90 mL/min/{1.73_m2} (ref >90)
Glucose: 70 mg/dl (ref 70–140)
Potassium: 4.2 mEq/L (ref 3.5–5.1)
Sodium: 142 mEq/L (ref 136–145)
Total Protein: 8 g/dL (ref 6.4–8.3)

## 2016-09-10 LAB — CBC WITH DIFFERENTIAL/PLATELET
BASO%: 0.6 % (ref 0.0–2.0)
BASOS ABS: 0.1 10*3/uL (ref 0.0–0.1)
EOS%: 3.9 % (ref 0.0–7.0)
Eosinophils Absolute: 0.4 10*3/uL (ref 0.0–0.5)
HEMATOCRIT: 44.4 % (ref 38.4–49.9)
HEMOGLOBIN: 14.5 g/dL (ref 13.0–17.1)
LYMPH#: 1.9 10*3/uL (ref 0.9–3.3)
LYMPH%: 19.3 % (ref 14.0–49.0)
MCH: 27.1 pg — ABNORMAL LOW (ref 27.2–33.4)
MCHC: 32.6 g/dL (ref 32.0–36.0)
MCV: 83.1 fL (ref 79.3–98.0)
MONO#: 1.1 10*3/uL — AB (ref 0.1–0.9)
MONO%: 12 % (ref 0.0–14.0)
NEUT#: 6.2 10*3/uL (ref 1.5–6.5)
NEUT%: 64.2 % (ref 39.0–75.0)
PLATELETS: 171 10*3/uL (ref 140–400)
RBC: 5.34 10*6/uL (ref 4.20–5.82)
RDW: 16 % — AB (ref 11.0–14.6)
WBC: 9.6 10*3/uL (ref 4.0–10.3)

## 2016-09-10 MED ORDER — DOXORUBICIN HCL CHEMO IV INJECTION 2 MG/ML
25.0000 mg/m2 | Freq: Once | INTRAVENOUS | Status: AC
  Administered 2016-09-10: 62 mg via INTRAVENOUS
  Filled 2016-09-10: qty 31

## 2016-09-10 MED ORDER — PALONOSETRON HCL INJECTION 0.25 MG/5ML
0.2500 mg | Freq: Once | INTRAVENOUS | Status: AC
  Administered 2016-09-10: 0.25 mg via INTRAVENOUS

## 2016-09-10 MED ORDER — VINBLASTINE SULFATE CHEMO INJECTION 1 MG/ML
6.0500 mg/m2 | Freq: Once | INTRAVENOUS | Status: AC
  Administered 2016-09-10: 15 mg via INTRAVENOUS
  Filled 2016-09-10: qty 15

## 2016-09-10 MED ORDER — PALONOSETRON HCL INJECTION 0.25 MG/5ML
INTRAVENOUS | Status: AC
  Filled 2016-09-10: qty 5

## 2016-09-10 MED ORDER — SODIUM CHLORIDE 0.9 % IV SOLN: 6.0000 mg/m2 | Freq: Once | INTRAVENOUS | Status: DC

## 2016-09-10 MED ORDER — SODIUM CHLORIDE 0.9 % IV SOLN
Freq: Once | INTRAVENOUS | Status: AC
  Administered 2016-09-10: 11:00:00 via INTRAVENOUS

## 2016-09-10 MED ORDER — SODIUM CHLORIDE 0.9 % IV SOLN
Freq: Once | INTRAVENOUS | Status: AC
  Administered 2016-09-10: 11:00:00 via INTRAVENOUS
  Filled 2016-09-10: qty 5

## 2016-09-10 MED ORDER — SODIUM CHLORIDE 0.9 % IV SOLN
10.0000 [IU]/m2 | Freq: Once | INTRAVENOUS | Status: AC
  Administered 2016-09-10: 25 [IU] via INTRAVENOUS
  Filled 2016-09-10: qty 8.33

## 2016-09-10 MED ORDER — HEPARIN SOD (PORK) LOCK FLUSH 100 UNIT/ML IV SOLN
500.0000 [IU] | Freq: Once | INTRAVENOUS | Status: AC | PRN
  Administered 2016-09-10: 500 [IU]
  Filled 2016-09-10: qty 5

## 2016-09-10 MED ORDER — SODIUM CHLORIDE 0.9% FLUSH
10.0000 mL | INTRAVENOUS | Status: DC | PRN
  Administered 2016-09-10: 10 mL
  Filled 2016-09-10: qty 10

## 2016-09-10 MED ORDER — DACARBAZINE 200 MG IV SOLR
375.0000 mg/m2 | Freq: Once | INTRAVENOUS | Status: AC
  Administered 2016-09-10: 940 mg via INTRAVENOUS
  Filled 2016-09-10: qty 47

## 2016-09-12 NOTE — Progress Notes (Signed)
[name]    HEMATOLOGY/ONCOLOGY CLINIC NOTE  Date of Service: .09/10/2016  Patient Care Team: No Pcp Per Patient as PCP - General (General Practice)  CHIEF COMPLAINTS/PURPOSE OF CONSULTATION:  Newly diagnosed classical [name]   HISTORY OF PRESENTING ILLNESS:   Craig Schmitt is a wonderful 30 y.o. male who has been referred to [place] by Dr .Jackolyn Confer MD for evaluation and management of newly diagnosed classical [name].  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 The lymph nodes in his neck were progressively growing and he noted some enlarged lymph nodes under his left armpit  and therefore sought additional attention.   He had a CT of the neck  on 05/24/2016 that showed multiple enlarged lymph nodes in the left neck concerning for possible lymphoma. No pharyngeal masses were noted. CT of the chest/Abd done the same day showed left posterior cervical, supraclavicular, left axillary, mediastinal and right hilar lymphadenopathy highly suspicious for lymphoma. No findings suspicious for lymphoma beneath the diaphragm. Spleen was normal in size.  Patient was seen by Dr. Jackolyn Confer had a left cervical lymph node biopsy on 07/20/2016 that showed classical [name], nodular sclerosis type.  Patient reports no fevers no chills no night sweats no weight loss no fatigue. No change in his breathing. No chest pain. No abdominal pain or discomfort. No change in bowel habits. No headaches or focal neurological deficits.  He is here for his clinic visit with his brother for discussion of his diagnosis. He was not aware of the final pathologic diagnosis till we discussed it in clinic today.   INTERVAL HISTORY  Craig Schmitt is here for followup prior to 1 cycle D15 of ABVD. He notes that his cervical lymph  nodes and left axillary lymph node has nearly resolved. He had no prohibitive toxicities that he could clinically appreciated. However his liver function tests were elevated and his second dose of treatment was delayed by a week or so. His LFTs have now completely normalized and we shall proceed as planned. He notes some mild grade 1 fatigue for a few days after treatment.    MEDICAL HISTORY:  1)Hives to pollen  SURGICAL HISTORY: Past Surgical History:  Procedure Laterality Date  . IR GENERIC HISTORICAL  08/02/2016   IR FLUORO GUIDE PORT INSERTION RIGHT 08/02/2016 [name], MD [facility]  . IR GENERIC HISTORICAL  08/02/2016   IR US GUIDE VASC ACCESS RIGHT 08/02/2016 [name], MD [facility]  . KNEE ARTHROSCOPY    . LYMPH GLAND EXCISION Left 07/20/2016   Procedure: LEFT CERVICAL LYMPH NODE BIOPSY;  Surgeon: Jackolyn Confer, MD;  Location: [facility];  Service: General;  Laterality: Left;    SOCIAL HISTORY: Social History   Social History  . Marital status: Single    Spouse name: N/A  . Number of children: N/A  . Years of education: N/A   Occupational History  . Not on file.   Social History Main Topics  . Smoking status: Never Smoker  . Smokeless tobacco: Never Used  . Alcohol use No  . Drug use: No  . Sexual activity: Not on file   Other Topics Concern  . Not on file   Social History Narrative  . No narrative on file  Never smoker   no significant alcohol use  FAMILY HISTORY: family history of hypertension  No known family history of blood disorders or cancer .  ALLERGIES:  has No Known Allergies.  Allergic to pollen no other known drug or food allergies .  MEDICATIONS:  Current Outpatient Prescriptions  Medication Sig Dispense Refill  . dexamethasone (DECADRON) 4 MG tablet Take 2 tablets by mouth once a day on the day after chemotherapy and then take 2 tablets two times a day for 2 days. Take with food. 30 tablet 1  . ibuprofen  (ADVIL,MOTRIN) 200 MG tablet Take 200 mg by mouth every 6 (six) hours as needed for mild pain.    [name] lidocaine-prilocaine (EMLA) cream Apply to affected area once 30 g 3  . ondansetron (ZOFRAN) 8 MG tablet Take 1 tablet (8 mg total) by mouth 2 (two) times daily as needed. Start on the third day after chemotherapy. 30 tablet 1  . prochlorperazine (COMPAZINE) 10 MG tablet Take 1 tablet (10 mg total) by mouth every 6 (six) hours as needed (Nausea or vomiting). 30 tablet 1   No current facility-administered medications for this visit.     REVIEW OF SYSTEMS:    10 Point review of Systems was done is negative except as noted above.  PHYSICAL EXAMINATION: ECOG PERFORMANCE STATUS: 0 - Asymptomatic  . Vitals:   09/10/16 0915  BP: 120/76  Pulse: (!) 58  Resp: 18  Temp: 98 F (36.7 C)   Filed Weights   09/10/16 0915  Weight: 271 lb 11.2 oz (123.2 kg)   .Body mass index is 35.85 kg/m.  GENERAL:alert, well-built African-American gentleman in no acute distress and comfortable SKIN: skin color, texture, turgor are normal, no rashes or significant lesions EYES: normal, conjunctiva are pink and non-injected, sclera clear OROPHARYNX:no exudate, no erythema and lips, buccal mucosa, and tongue normal  NECK: supple, no JVD, thyroid normal size, non-tender, without nodularity LYMPH:  Palpable left cervical , left supraclavicular and left axillary lymph nodes .no palpable inguinal lymph nodes.  LUNGS: clear to auscultation with normal respiratory effort HEART: regular rate & rhythm,  no murmurs and no lower extremity edema ABDOMEN: abdomen soft, non-tender, normoactive bowel sounds , no palpable hepatosplenomegaly . Musculoskeletal: no cyanosis of digits and no clubbing  PSYCH: alert & oriented x 3 with fluent speech NEURO: no focal motor/sensory deficits  LABORATORY DATA:  I have reviewed the data as listed  . CBC Latest Ref Rng & Units 09/10/2016 08/27/2016 08/13/2016  WBC 4.0 - 10.3  10e3/uL 9.6 5.1 12.4(H)  Hemoglobin 13.0 - 17.1 g/dL 14.5 13.0 12.9(L)  Hematocrit 38.4 - 49.9 % 44.4 40.6 40.4  Platelets 140 - 400 10e3/uL 171 194 205    . CMP Latest Ref Rng & Units 09/10/2016 08/27/2016 08/13/2016  Glucose 70 - 140 mg/dl 70 92 91  BUN 7.0 - 26.0 mg/dL 14.9 12.9 11.7  Creatinine 0.7 - 1.3 mg/dL 1.1 1.1 1.1  Sodium 136 - 145 mEq/L 142 142 138  Potassium 3.5 - 5.1 mEq/L 4.2 3.9 3.9  Chloride 101 - 111 mmol/L - - -  CO2 22 - 29 mEq/L 27 27 25   Calcium 8.4 - 10.4 mg/dL 9.7 9.1 9.1  Total Protein 6.4 - 8.3 g/dL 8.0 7.5 8.2  Total Bilirubin 0.20 - 1.20 mg/dL 0.39 0.41 0.40  Alkaline Phos 40 - 150 U/L 84 75 91  AST 5 - 34 U/L 25 222(HH) 15  ALT 0 - 55 U/L 40 133(H) 15   Component     Latest Ref Rng & Units 07/27/2016  Sed Rate  0 - 15 mm/hr 43 (H)  LDH     125 - 245 U/L 157  Hep C Virus Ab     0.0 - 0.9 s/co ratio <0.1  Hepatitis B Surface Ag     Negative Negative      RADIOGRAPHIC STUDIES: I have personally reviewed the radiological images as listed and agreed with the findings in the report.  .No results found.     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 Stage IVA as per PET/CT scan suggestive of bone marrow involvement. Sedimentation rate 43 ECHO nl EF PFTs WNL Port placed Chemi-counseling done and consent obtained.  #2 Elevated transaminase after C1D1 treatment - now normalized. Plan -Labs today completely normal -We shall proceed with cycle 1 day 15 of ABVD without any dose reductions -will monitor LFTs and determine need for dose adjustment if significantly elevated again. -recommend complete avoidance of alcohol and other potential hepatotoxic medications.  RTC in 2 weeks with Dr Irene Limbo with labs on C2D1 of treatment . Earlier if any new concerns.  I spent 20 minutes  counseling the patient face to face. The total time spent in the appointment was 20 minutes and more than 50% was on counseling and direct patient cares.    Sullivan Lone MD [place] AAHIVMS [facility] Hematology/Oncology [facility]  (Office):       [phone] (Work cell):  [phone] (Fax):           [phone]

## 2016-09-17 ENCOUNTER — Telehealth: Payer: Self-pay | Admitting: Hematology

## 2016-09-17 NOTE — Telephone Encounter (Signed)
SPOKE WITH PATIENT RE 11/21 AND 11/24 APPOINTMENTS. [facility] 11/24 - LAB/FU 11/21 DUE TO HOLIDAY AND PROVIDER PAL.

## 2016-09-21 ENCOUNTER — Encounter: Payer: Self-pay | Admitting: Hematology

## 2016-09-21 ENCOUNTER — Ambulatory Visit (HOSPITAL_BASED_OUTPATIENT_CLINIC_OR_DEPARTMENT_OTHER): Payer: Commercial Managed Care - HMO | Admitting: Hematology

## 2016-09-21 ENCOUNTER — Other Ambulatory Visit (HOSPITAL_BASED_OUTPATIENT_CLINIC_OR_DEPARTMENT_OTHER): Payer: Commercial Managed Care - HMO

## 2016-09-21 VITALS — BP 152/80 | HR 68 | Temp 97.8°F | Resp 20 | Ht 73.0 in | Wt 285.7 lb

## 2016-09-21 DIAGNOSIS — R945 Abnormal results of liver function studies: Secondary | ICD-10-CM

## 2016-09-21 DIAGNOSIS — R110 Nausea: Secondary | ICD-10-CM

## 2016-09-21 DIAGNOSIS — R7989 Other specified abnormal findings of blood chemistry: Secondary | ICD-10-CM | POA: Diagnosis not present

## 2016-09-21 DIAGNOSIS — C8118 Nodular sclerosis classical Hodgkin lymphoma, lymph nodes of multiple sites: Secondary | ICD-10-CM

## 2016-09-21 LAB — CBC WITH DIFFERENTIAL/PLATELET
BASO%: 0.6 % (ref 0.0–2.0)
Basophils Absolute: 0 10*3/uL (ref 0.0–0.1)
EOS ABS: 0.5 10*3/uL (ref 0.0–0.5)
EOS%: 8.5 % — ABNORMAL HIGH (ref 0.0–7.0)
HCT: 42.3 % (ref 38.4–49.9)
HGB: 13.5 g/dL (ref 13.0–17.1)
LYMPH%: 26.1 % (ref 14.0–49.0)
MCH: 27.2 pg (ref 27.2–33.4)
MCHC: 31.9 g/dL — AB (ref 32.0–36.0)
MCV: 85 fL (ref 79.3–98.0)
MONO#: 0.6 10*3/uL (ref 0.1–0.9)
MONO%: 10.9 % (ref 0.0–14.0)
NEUT%: 53.9 % (ref 39.0–75.0)
NEUTROS ABS: 3.1 10*3/uL (ref 1.5–6.5)
PLATELETS: 146 10*3/uL (ref 140–400)
RBC: 4.97 10*6/uL (ref 4.20–5.82)
RDW: 15.7 % — ABNORMAL HIGH (ref 11.0–14.6)
WBC: 5.8 10*3/uL (ref 4.0–10.3)
lymph#: 1.5 10*3/uL (ref 0.9–3.3)

## 2016-09-21 LAB — COMPREHENSIVE METABOLIC PANEL
ALT: 30 U/L (ref 0–55)
ANION GAP: 9 meq/L (ref 3–11)
AST: 21 U/L (ref 5–34)
Albumin: 3.5 g/dL (ref 3.5–5.0)
Alkaline Phosphatase: 74 U/L (ref 40–150)
BUN: 15.5 mg/dL (ref 7.0–26.0)
CHLORIDE: 106 meq/L (ref 98–109)
CO2: 27 meq/L (ref 22–29)
Calcium: 9.4 mg/dL (ref 8.4–10.4)
Creatinine: 1.1 mg/dL (ref 0.7–1.3)
GLUCOSE: 87 mg/dL (ref 70–140)
POTASSIUM: 4.3 meq/L (ref 3.5–5.1)
SODIUM: 142 meq/L (ref 136–145)
TOTAL PROTEIN: 7 g/dL (ref 6.4–8.3)
Total Bilirubin: 0.3 mg/dL (ref 0.20–1.20)

## 2016-09-24 ENCOUNTER — Ambulatory Visit (HOSPITAL_BASED_OUTPATIENT_CLINIC_OR_DEPARTMENT_OTHER): Payer: Commercial Managed Care - HMO

## 2016-09-24 ENCOUNTER — Telehealth: Payer: Self-pay | Admitting: Hematology

## 2016-09-24 VITALS — BP 128/75 | HR 60 | Temp 98.2°F | Resp 18

## 2016-09-24 DIAGNOSIS — Z5111 Encounter for antineoplastic chemotherapy: Secondary | ICD-10-CM

## 2016-09-24 DIAGNOSIS — C8118 Nodular sclerosis classical Hodgkin lymphoma, lymph nodes of multiple sites: Secondary | ICD-10-CM | POA: Diagnosis not present

## 2016-09-24 MED ORDER — PALONOSETRON HCL INJECTION 0.25 MG/5ML
0.2500 mg | Freq: Once | INTRAVENOUS | Status: AC
  Administered 2016-09-24: 0.25 mg via INTRAVENOUS

## 2016-09-24 MED ORDER — BLEOMYCIN SULFATE CHEMO INJECTION 30 UNIT
10.0000 [IU]/m2 | Freq: Once | INTRAMUSCULAR | Status: AC
  Administered 2016-09-24: 25 [IU] via INTRAVENOUS
  Filled 2016-09-24: qty 8.33

## 2016-09-24 MED ORDER — DOXORUBICIN HCL CHEMO IV INJECTION 2 MG/ML
25.0000 mg/m2 | Freq: Once | INTRAVENOUS | Status: AC
  Administered 2016-09-24: 62 mg via INTRAVENOUS
  Filled 2016-09-24: qty 31

## 2016-09-24 MED ORDER — VINBLASTINE SULFATE CHEMO INJECTION 1 MG/ML
6.0500 mg/m2 | Freq: Once | INTRAVENOUS | Status: AC
  Administered 2016-09-24: 15 mg via INTRAVENOUS
  Filled 2016-09-24: qty 15

## 2016-09-24 MED ORDER — SODIUM CHLORIDE 0.9 % IV SOLN
Freq: Once | INTRAVENOUS | Status: AC
  Administered 2016-09-24: 10:00:00 via INTRAVENOUS

## 2016-09-24 MED ORDER — SODIUM CHLORIDE 0.9 % IV SOLN
375.0000 mg/m2 | Freq: Once | INTRAVENOUS | Status: AC
  Administered 2016-09-24: 940 mg via INTRAVENOUS
  Filled 2016-09-24: qty 47

## 2016-09-24 MED ORDER — PALONOSETRON HCL INJECTION 0.25 MG/5ML
INTRAVENOUS | Status: AC
  Filled 2016-09-24: qty 5

## 2016-09-24 MED ORDER — SODIUM CHLORIDE 0.9% FLUSH
10.0000 mL | INTRAVENOUS | Status: DC | PRN
  Administered 2016-09-24: 10 mL
  Filled 2016-09-24: qty 10

## 2016-09-24 MED ORDER — SODIUM CHLORIDE 0.9 % IV SOLN
Freq: Once | INTRAVENOUS | Status: AC
  Administered 2016-09-24: 11:00:00 via INTRAVENOUS
  Filled 2016-09-24: qty 5

## 2016-09-24 MED ORDER — HEPARIN SOD (PORK) LOCK FLUSH 100 UNIT/ML IV SOLN
500.0000 [IU] | Freq: Once | INTRAVENOUS | Status: AC | PRN
  Administered 2016-09-24: 500 [IU]
  Filled 2016-09-24: qty 5

## 2016-09-24 NOTE — Telephone Encounter (Signed)
Gave patient avs report and appointments for December  °

## 2016-09-24 NOTE — Patient Instructions (Signed)
[facility] Discharge Instructions for Patients Receiving Chemotherapy  Today you received the following chemotherapy agents:  Adriamycin, Velban, Bleomycin, and DTIC.  To help prevent nausea and vomiting after your treatment, we encourage you to take your nausea medication as directed.   If you develop nausea and vomiting that is not controlled by your nausea medication, call the clinic.   BELOW ARE SYMPTOMS THAT SHOULD BE REPORTED IMMEDIATELY:  *FEVER GREATER THAN 100.5 F  *CHILLS WITH OR WITHOUT FEVER  NAUSEA AND VOMITING THAT IS NOT CONTROLLED WITH YOUR NAUSEA MEDICATION  *UNUSUAL SHORTNESS OF BREATH  *UNUSUAL BRUISING OR BLEEDING  TENDERNESS IN MOUTH AND THROAT WITH OR WITHOUT PRESENCE OF ULCERS  *URINARY PROBLEMS  *BOWEL PROBLEMS  UNUSUAL RASH Items with * indicate a potential emergency and should be followed up as soon as possible.  Feel free to call the clinic you have any questions or concerns. The clinic phone number is [phone].  Please show the [facility] at check-in to the Emergency Department and triage nurse.  Doxorubicin injection What is this medicine? DOXORUBICIN (dox oh ROO bi sin) is a chemotherapy drug. It is used to treat many kinds of cancer like Hodgkin's disease, leukemia, non-[name], neuroblastoma, sarcoma, and Wilms' tumor. It is also used to treat bladder cancer, breast cancer, lung cancer, ovarian cancer, stomach cancer, and thyroid cancer. This medicine may be used for other purposes; ask your health care provider or pharmacist if you have questions. What should I tell my health care provider before I take this medicine? They need to know if you have any of these conditions: -blood disorders -heart disease, recent heart attack -infection (especially a virus infection such as chickenpox, cold sores, or herpes) -irregular heartbeat -liver disease -recent or ongoing radiation therapy -an unusual or allergic  reaction to doxorubicin, other chemotherapy agents, other medicines, foods, dyes, or preservatives -pregnant or trying to get pregnant -breast-feeding How should I use this medicine? This drug is given as an infusion into a vein. It is administered in a hospital or clinic by a specially trained health care professional. If you have pain, swelling, burning or any unusual feeling around the site of your injection, tell your health care professional right away. Talk to your pediatrician regarding the use of this medicine in children. Special care may be needed. Overdosage: If you think you have taken too much of this medicine contact a poison control center or emergency room at once. NOTE: This medicine is only for you. Do not share this medicine with others. What if I miss a dose? It is important not to miss your dose. Call your doctor or health care professional if you are unable to keep an appointment. What may interact with this medicine? Do not take this medicine with any of the following medications: -cisapride -droperidol -halofantrine -pimozide -zidovudine This medicine may also interact with the following medications: -chloroquine -chlorpromazine -clarithromycin -cyclophosphamide -cyclosporine -erythromycin -medicines for depression, anxiety, or psychotic disturbances -medicines for irregular heart beat like amiodarone, bepridil, dofetilide, encainide, flecainide, propafenone, quinidine -medicines for seizures like ethotoin, fosphenytoin, phenytoin -medicines for nausea, vomiting like dolasetron, ondansetron, palonosetron -medicines to increase blood counts like filgrastim, pegfilgrastim, sargramostim -methadone -methotrexate -pentamidine -progesterone -vaccines -verapamil Talk to your doctor or health care professional before taking any of these medicines: -acetaminophen -aspirin -ibuprofen -ketoprofen -naproxen This list may not describe all possible interactions.  Give your health care provider a list of all the medicines, herbs, non-prescription drugs, or dietary supplements you  use. Also tell them if you smoke, drink alcohol, or use illegal drugs. Some items may interact with your medicine. What should I watch for while using this medicine? Your condition will be monitored carefully while you are receiving this medicine. You will need important blood work done while you are taking this medicine. This drug may make you feel generally unwell. This is not uncommon, as chemotherapy can affect healthy cells as well as cancer cells. Report any side effects. Continue your course of treatment even though you feel ill unless your doctor tells you to stop. Your urine may turn red for a few days after your dose. This is not blood. If your urine is dark or brown, call your doctor. In some cases, you may be given additional medicines to help with side effects. Follow all directions for their use. Call your doctor or health care professional for advice if you get a fever, chills or sore throat, or other symptoms of a cold or flu. Do not treat yourself. This drug decreases your body's ability to fight infections. Try to avoid being around people who are sick. This medicine may increase your risk to bruise or bleed. Call your doctor or health care professional if you notice any unusual bleeding. Be careful brushing and flossing your teeth or using a toothpick because you may get an infection or bleed more easily. If you have any dental work done, tell your dentist you are receiving this medicine. Avoid taking products that contain aspirin, acetaminophen, ibuprofen, naproxen, or ketoprofen unless instructed by your doctor. These medicines may hide a fever. Men and women of childbearing age should use effective birth control methods while using taking this medicine. Do not become pregnant while taking this medicine. There is a potential for serious side effects to an unborn child.  Talk to your health care professional or pharmacist for more information. Do not breast-feed an infant while taking this medicine. Do not let others touch your urine or other body fluids for 5 days after each treatment with this medicine. Caregivers should wear latex gloves to avoid touching body fluids during this time. There is a maximum amount of this medicine you should receive throughout your life. The amount depends on the medical condition being treated and your overall health. Your doctor will watch how much of this medicine you receive in your lifetime. Tell your doctor if you have taken this medicine before. What side effects may I notice from receiving this medicine? Side effects that you should report to your doctor or health care professional as soon as possible: -allergic reactions like skin rash, itching or hives, swelling of the face, lips, or tongue -low blood counts - this medicine may decrease the number of white blood cells, red blood cells and platelets. You may be at increased risk for infections and bleeding. -signs of infection - fever or chills, cough, sore throat, pain or difficulty passing urine -signs of decreased platelets or bleeding - bruising, pinpoint red spots on the skin, black, tarry stools, blood in the urine -signs of decreased red blood cells - unusually weak or tired, fainting spells, lightheadedness -breathing problems -chest pain -fast, irregular heartbeat -mouth sores -nausea, vomiting -pain, swelling, redness at site where injected -pain, tingling, numbness in the hands or feet -swelling of ankles, feet, or hands -unusual bleeding or bruising Side effects that usually do not require medical attention (report to your doctor or health care professional if they continue or are bothersome): -diarrhea -facial flushing -hair loss -  loss of appetite -missed menstrual periods -nail discoloration or damage -red or watery eyes -red colored urine -stomach  upset This list may not describe all possible side effects. Call your doctor for medical advice about side effects. You may report side effects to FDA at 1-800-FDA-1088. Where should I keep my medicine? This drug is given in a hospital or clinic and will not be stored at home. NOTE: This sheet is a summary. It may not cover all possible information. If you have questions about this medicine, talk to your doctor, pharmacist, or health care provider.    2016, [employer] Standard. (2013-02-13 09:54:34)  Vinblastine injection What is this medicine? VINBLASTINE (vin BLAS teen) is a chemotherapy drug. It slows the growth of cancer cells. This medicine is used to treat many types of cancer like breast cancer, testicular cancer, Hodgkin's disease, non-[name], and sarcoma. This medicine may be used for other purposes; ask your health care provider or pharmacist if you have questions. What should I tell my health care provider before I take this medicine? They need to know if you have any of these conditions: -blood disorders -dental disease -gout -infection (especially a virus infection such as chickenpox, cold sores, or herpes) -liver disease -lung disease -nervous system disease -recent or ongoing radiation therapy -an unusual or allergic reaction to vinblastine, other chemotherapy agents, other medicines, foods, dyes, or preservatives -pregnant or trying to get pregnant -breast-feeding How should I use this medicine? This drug is given as an infusion into a vein. It is administered in a hospital or clinic by a specially trained health care professional. If you have pain, swelling, burning or any unusual feeling around the site of your injection, tell your health care professional right away. Talk to your pediatrician regarding the use of this medicine in children. While this drug may be prescribed for selected conditions, precautions do apply. Overdosage: If you think you have  taken too much of this medicine contact a poison control center or emergency room at once. NOTE: This medicine is only for you. Do not share this medicine with others. What if I miss a dose? It is important not to miss your dose. Call your doctor or health care professional if you are unable to keep an appointment. What may interact with this medicine? Do not take this medicine with any of the following medications: -erythromycin -itraconazole -mibefradil -voriconazole This medicine may also interact with the following medications: -cyclosporine -fluconazole -ketoconazole -medicines for seizures like phenytoin -medicines to increase blood counts like filgrastim, pegfilgrastim, sargramostim -vaccines -verapamil Talk to your doctor or health care professional before taking any of these medicines: -acetaminophen -aspirin -ibuprofen -ketoprofen -naproxen This list may not describe all possible interactions. Give your health care provider a list of all the medicines, herbs, non-prescription drugs, or dietary supplements you use. Also tell them if you smoke, drink alcohol, or use illegal drugs. Some items may interact with your medicine. What should I watch for while using this medicine? Your condition will be monitored carefully while you are receiving this medicine. You will need important blood work done while you are taking this medicine. This drug may make you feel generally unwell. This is not uncommon, as chemotherapy can affect healthy cells as well as cancer cells. Report any side effects. Continue your course of treatment even though you feel ill unless your doctor tells you to stop. In some cases, you may be given additional medicines to help with side effects. Follow all directions for their use.  Call your doctor or health care professional for advice if you get a fever, chills or sore throat, or other symptoms of a cold or flu. Do not treat yourself. This drug decreases your  body's ability to fight infections. Try to avoid being around people who are sick. This medicine may increase your risk to bruise or bleed. Call your doctor or health care professional if you notice any unusual bleeding. Be careful brushing and flossing your teeth or using a toothpick because you may get an infection or bleed more easily. If you have any dental work done, tell your dentist you are receiving this medicine. Avoid taking products that contain aspirin, acetaminophen, ibuprofen, naproxen, or ketoprofen unless instructed by your doctor. These medicines may hide a fever. Do not become pregnant while taking this medicine. Women should inform their doctor if they wish to become pregnant or think they might be pregnant. There is a potential for serious side effects to an unborn child. Talk to your health care professional or pharmacist for more information. Do not breast-feed an infant while taking this medicine. Men may have a lower sperm count while taking this medicine. Talk to your doctor if you plan to father a child. What side effects may I notice from receiving this medicine? Side effects that you should report to your doctor or health care professional as soon as possible: -allergic reactions like skin rash, itching or hives, swelling of the face, lips, or tongue -low blood counts - This drug may decrease the number of white blood cells, red blood cells and platelets. You may be at increased risk for infections and bleeding. -signs of infection - fever or chills, cough, sore throat, pain or difficulty passing urine -signs of decreased platelets or bleeding - bruising, pinpoint red spots on the skin, black, tarry stools, nosebleeds -signs of decreased red blood cells - unusually weak or tired, fainting spells, lightheadedness -breathing problems -changes in hearing -change in the amount of urine -chest pain -high blood pressure -mouth sores -nausea and vomiting -pain, swelling,  redness or irritation at the injection site -pain, tingling, numbness in the hands or feet -problems with balance, dizziness -seizures Side effects that usually do not require medical attention (report to your doctor or health care professional if they continue or are bothersome): -constipation -hair loss -jaw pain -loss of appetite -sensitivity to light -stomach pain -tumor pain This list may not describe all possible side effects. Call your doctor for medical advice about side effects. You may report side effects to FDA at 1-800-FDA-1088. Where should I keep my medicine? This drug is given in a hospital or clinic and will not be stored at home. NOTE: This sheet is a summary. It may not cover all possible information. If you have questions about this medicine, talk to your doctor, pharmacist, or health care provider.    2016, [employer]/Gold Standard. (2008-07-15 17:15:59)  Bleomycin injection What is this medicine? BLEOMYCIN (blee oh MYE sin) is a chemotherapy drug. It is used to treat many kinds of cancer like lymphoma, cervical cancer, head and neck cancer, and testicular cancer. It is also used to prevent and to treat fluid build-up around the lungs caused by some cancers. This medicine may be used for other purposes; ask your health care provider or pharmacist if you have questions. What should I tell my health care provider before I take this medicine? They need to know if you have any of these conditions: -cigarette smoker -kidney disease -lung disease -recent or  ongoing radiation therapy -an unusual or allergic reaction to bleomycin, other chemotherapy agents, other medicines, foods, dyes, or preservatives -pregnant or trying to get pregnant -breast-feeding How should I use this medicine? This drug is given as an infusion into a vein or a body cavity. It can also be given as an injection into a muscle or under the skin. It is administered in a hospital or clinic by a  specially trained health care professional. Talk to your pediatrician regarding the use of this medicine in children. Special care may be needed. Overdosage: If you think you have taken too much of this medicine contact a poison control center or emergency room at once. NOTE: This medicine is only for you. Do not share this medicine with others. What if I miss a dose? It is important not to miss your dose. Call your doctor or health care professional if you are unable to keep an appointment. What may interact with this medicine? -certain antibiotics given by injection -cisplatin -cyclosporine -diuretics -foscarnet -medicines to increase blood counts like filgrastim, pegfilgrastim, sargramostim -vaccines This list may not describe all possible interactions. Give your health care provider a list of all the medicines, herbs, non-prescription drugs, or dietary supplements you use. Also tell them if you smoke, drink alcohol, or use illegal drugs. Some items may interact with your medicine. What should I watch for while using this medicine? Visit your doctor for checks on your progress. This drug may make you feel generally unwell. This is not uncommon, as chemotherapy can affect healthy cells as well as cancer cells. Report any side effects. Continue your course of treatment even though you feel ill unless your doctor tells you to stop. Call your doctor or health care professional for advice if you get a fever, chills or sore throat, or other symptoms of a cold or flu. Do not treat yourself. This drug decreases your body's ability to fight infections. Try to avoid being around people who are sick. Avoid taking products that contain aspirin, acetaminophen, ibuprofen, naproxen, or ketoprofen unless instructed by your doctor. These medicines may hide a fever. Do not become pregnant while taking this medicine. Women should inform their doctor if they wish to become pregnant or think they might be pregnant.  There is a potential for serious side effects to an unborn child. Talk to your health care professional or pharmacist for more information. Do not breast-feed an infant while taking this medicine. There is a maximum amount of this medicine you should receive throughout your life. The amount depends on the medical condition being treated and your overall health. Your doctor will watch how much of this medicine you receive in your lifetime. Tell your doctor if you have taken this medicine before. What side effects may I notice from receiving this medicine? Side effects that you should report to your doctor or health care professional as soon as possible: -allergic reactions like skin rash, itching or hives, swelling of the face, lips, or tongue -breathing problems -chest pain -confusion -cough -fast, irregular heartbeat -feeling faint or lightheaded, falls -fever or chills -mouth sores -pain, tingling, numbness in the hands or feet -trouble passing urine or change in the amount of urine -yellowing of the eyes or skin Side effects that usually do not require medical attention (report to your doctor or health care professional if they continue or are bothersome): -darker skin color -hair loss -irritation at site where injected -loss of appetite -nail changes -nausea and vomiting -weight loss This list  may not describe all possible side effects. Call your doctor for medical advice about side effects. You may report side effects to FDA at 1-800-FDA-1088. Where should I keep my medicine? This drug is given in a hospital or clinic and will not be stored at home. NOTE: This sheet is a summary. It may not cover all possible information. If you have questions about this medicine, talk to your doctor, pharmacist, or health care provider.    2016, [employer]/Gold Standard. (2013-02-13 09:36:48)  Dacarbazine, DTIC injection What is this medicine? DACARBAZINE (da KAR ba zeen) is a chemotherapy drug.  This medicine is used to treat skin cancer. It is also used with other medicines to treat Hodgkin's disease. This medicine may be used for other purposes; ask your health care provider or pharmacist if you have questions. What should I tell my health care provider before I take this medicine? They need to know if you have any of these conditions: -infection (especially virus infection such as chickenpox, cold sores, or herpes) -kidney disease -liver disease -low blood counts like low platelets, red blood cells, white blood cells -recent radiation therapy -an unusual or allergic reaction to dacarbazine, other chemotherapy agents, other medicines, foods, dyes, or preservatives -pregnant or trying to get pregnant -breast-feeding How should I use this medicine? This drug is given as an injection or infusion into a vein. It is administered in a hospital or clinic by a specially trained health care professional. Talk to your pediatrician regarding the use of this medicine in children. While this drug may be prescribed for selected conditions, precautions do apply. Overdosage: If you think you have taken too much of this medicine contact a poison control center or emergency room at once. NOTE: This medicine is only for you. Do not share this medicine with others. What if I miss a dose? It is important not to miss your dose. Call your doctor or health care professional if you are unable to keep an appointment. What may interact with this medicine? -medicines to increase blood counts like filgrastim, pegfilgrastim, sargramostim -vaccines This list may not describe all possible interactions. Give your health care provider a list of all the medicines, herbs, non-prescription drugs, or dietary supplements you use. Also tell them if you smoke, drink alcohol, or use illegal drugs. Some items may interact with your medicine. What should I watch for while using this medicine? Your condition will be monitored  carefully while you are receiving this medicine. You will need important blood work done while you are taking this medicine. This drug may make you feel generally unwell. This is not uncommon, as chemotherapy can affect healthy cells as well as cancer cells. Report any side effects. Continue your course of treatment even though you feel ill unless your doctor tells you to stop. Call your doctor or health care professional for advice if you get a fever, chills or sore throat, or other symptoms of a cold or flu. Do not treat yourself. This drug decreases your body's ability to fight infections. Try to avoid being around people who are sick. This medicine may increase your risk to bruise or bleed. Call your doctor or health care professional if you notice any unusual bleeding. Be careful brushing and flossing your teeth or using a toothpick because you may get an infection or bleed more easily. If you have any dental work done, tell your dentist you are receiving this medicine. Avoid taking products that contain aspirin, acetaminophen, ibuprofen, naproxen, or ketoprofen unless instructed  by your doctor. These medicines may hide a fever. Do not become pregnant while taking this medicine. Women should inform their doctor if they wish to become pregnant or think they might be pregnant. There is a potential for serious side effects to an unborn child. Talk to your health care professional or pharmacist for more information. Do not breast-feed an infant while taking this medicine. What side effects may I notice from receiving this medicine? Side effects that you should report to your doctor or health care professional as soon as possible: -allergic reactions like skin rash, itching or hives, swelling of the face, lips, or tongue -low blood counts - this medicine may decrease the number of white blood cells, red blood cells and platelets. You may be at increased risk for infections and bleeding. -signs of  infection - fever or chills, cough, sore throat, pain or difficulty passing urine -signs of decreased platelets or bleeding - bruising, pinpoint red spots on the skin, black, tarry stools, blood in the urine -signs of decreased red blood cells - unusually weak or tired, fainting spells, lightheadedness -breathing problems -muscle pains -pain at site where injected -trouble passing urine or change in the amount of urine -vomiting -yellowing of the eyes or skin Side effects that usually do not require medical attention (report to your doctor or health care professional if they continue or are bothersome): -diarrhea -hair loss -loss of appetite -nausea -skin more sensitive to sun or ultraviolet light -stomach upset This list may not describe all possible side effects. Call your doctor for medical advice about side effects. You may report side effects to FDA at 1-800-FDA-1088. Where should I keep my medicine? This drug is given in a hospital or clinic and will not be stored at home. NOTE: This sheet is a summary. It may not cover all possible information. If you have questions about this medicine, talk to your doctor, pharmacist, or health care provider.    2016, Elsevier/Gold Standard. (2008-02-06 16:56:39)

## 2016-09-24 NOTE — Progress Notes (Signed)
Blood return noted before, Every 3 cc during and after Adriamycin push. Blood return noted before ad after Velban and before Bleocin.

## 2016-09-26 NOTE — Progress Notes (Signed)
[name]    HEMATOLOGY/ONCOLOGY CLINIC NOTE  Date of Service: .09/21/2016  Patient Care Team: No Pcp Per Patient as PCP - General (General Practice)  CHIEF COMPLAINTS/PURPOSE OF CONSULTATION:  Newly diagnosed classical [name]   HISTORY OF PRESENTING ILLNESS:   Craig Schmitt is a wonderful 30 y.o. male who has been referred to [place] by Dr .Jackolyn Confer MD for evaluation and management of newly diagnosed classical [name].  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 The lymph nodes in his neck were progressively growing and he noted some enlarged lymph nodes under his left armpit  and therefore sought additional attention.   He had a CT of the neck  on 05/24/2016 that showed multiple enlarged lymph nodes in the left neck concerning for possible lymphoma. No pharyngeal masses were noted. CT of the chest/Abd done the same day showed left posterior cervical, supraclavicular, left axillary, mediastinal and right hilar lymphadenopathy highly suspicious for lymphoma. No findings suspicious for lymphoma beneath the diaphragm. Spleen was normal in size.  Patient was seen by Dr. Jackolyn Confer had a left cervical lymph node biopsy on 07/20/2016 that showed classical [name], nodular sclerosis type.  Patient reports no fevers no chills no night sweats no weight loss no fatigue. No change in his breathing. No chest pain. No abdominal pain or discomfort. No change in bowel habits. No headaches or focal neurological deficits.  He is here for his clinic visit with his brother for discussion of his diagnosis. He was not aware of the final pathologic diagnosis till we discussed it in clinic today.   INTERVAL HISTORY  Craig Schmitt is here for followup prior to C2D1 of ABVD. He notes that his cervical lymph nodes and  left axillary lymph node are not noticeable. Liver function tests remain stable. Notes some grade 1 fatigue for a few days after chemotherapy. No fevers or chills. No overt oral mucositis. Bowel movements are stable. Had minimal nausea on day 4 and 5 which resolved with Zofran. No vomiting. We discussed that he could use her Zofran as needed. Alternatively if he had this again we could extend his dexamethasone for a couple of additional days post-chemotherapy. He appears to be in good spirits.  No other acute new concerns.  MEDICAL HISTORY:  1)Hives to pollen  SURGICAL HISTORY: Past Surgical History:  Procedure Laterality Date  . IR GENERIC HISTORICAL  08/02/2016   IR FLUORO GUIDE PORT INSERTION RIGHT 08/02/2016 [name], MD [facility]  . IR GENERIC HISTORICAL  08/02/2016   IR US GUIDE VASC ACCESS RIGHT 08/02/2016 [name], MD [facility]  . KNEE ARTHROSCOPY    . LYMPH GLAND EXCISION Left 07/20/2016   Procedure: LEFT CERVICAL LYMPH NODE BIOPSY;  Surgeon: Jackolyn Confer, MD;  Location: [facility];  Service: General;  Laterality: Left;    SOCIAL HISTORY: Social History   Social History  . Marital status: Single    Spouse name: N/A  . Number of children: N/A  . Years of education: N/A   Occupational History  . Not on file.   Social History Main Topics  . Smoking status: Never Smoker  . Smokeless tobacco: Never Used  . Alcohol use No  . Drug use: No  . Sexual activity: Not on file   Other Topics Concern  . Not on file   Social  History Narrative  . No narrative on file  Never smoker   no significant alcohol use  FAMILY HISTORY: family history of hypertension  No known family history of blood disorders or cancer .  ALLERGIES:  has No Known Allergies.  Allergic to pollen no other known drug or food allergies .  MEDICATIONS:  Current Outpatient Prescriptions  Medication Sig Dispense Refill  . dexamethasone (DECADRON) 4 MG tablet Take 2  tablets by mouth once a day on the day after chemotherapy and then take 2 tablets two times a day for 2 days. Take with food. 30 tablet 1  . ibuprofen (ADVIL,MOTRIN) 200 MG tablet Take 200 mg by mouth every 6 (six) hours as needed for mild pain.    [name] lidocaine-prilocaine (EMLA) cream Apply to affected area once 30 g 3  . ondansetron (ZOFRAN) 8 MG tablet Take 1 tablet (8 mg total) by mouth 2 (two) times daily as needed. Start on the third day after chemotherapy. 30 tablet 1  . prochlorperazine (COMPAZINE) 10 MG tablet Take 1 tablet (10 mg total) by mouth every 6 (six) hours as needed (Nausea or vomiting). 30 tablet 1   No current facility-administered medications for this visit.     REVIEW OF SYSTEMS:    10 Point review of Systems was done is negative except as noted above.  PHYSICAL EXAMINATION: ECOG PERFORMANCE STATUS: 0 - Asymptomatic  . Vitals:   09/21/16 0915 09/21/16 0922  BP: (!) 153/91 (!) 152/80  Pulse: 68   Resp: 20   Temp: 97.8 F (36.6 C)    Filed Weights   09/21/16 0915  Weight: 285 lb 11.2 oz (129.6 kg)   .Body mass index is 37.69 kg/m.  GENERAL:alert, well-built African-American gentleman in no acute distress and comfortable SKIN: skin color, texture, turgor are normal, no rashes or significant lesions EYES: normal, conjunctiva are pink and non-injected, sclera clear OROPHARYNX:no exudate, no erythema and lips, buccal mucosa, and tongue normal  NECK: supple, no JVD, thyroid normal size, non-tender, without nodularity LYMPH:  Palpable left cervical , left supraclavicular and left axillary lymph nodes .no palpable inguinal lymph nodes.  LUNGS: clear to auscultation with normal respiratory effort HEART: regular rate & rhythm,  no murmurs and no lower extremity edema ABDOMEN: abdomen soft, non-tender, normoactive bowel sounds , no palpable hepatosplenomegaly . Musculoskeletal: no cyanosis of digits and no clubbing  PSYCH: alert & oriented x 3 with fluent  speech NEURO: no focal motor/sensory deficits  LABORATORY DATA:  I have reviewed the data as listed  . CBC Latest Ref Rng & Units 09/21/2016 09/10/2016 08/27/2016  WBC 4.0 - 10.3 10e3/uL 5.8 9.6 5.1  Hemoglobin 13.0 - 17.1 g/dL 13.5 14.5 13.0  Hematocrit 38.4 - 49.9 % 42.3 44.4 40.6  Platelets 140 - 400 10e3/uL 146 171 194    . CMP Latest Ref Rng & Units 09/21/2016 09/10/2016 08/27/2016  Glucose 70 - 140 mg/dl 87 70 92  BUN 7.0 - 26.0 mg/dL 15.5 14.9 12.9  Creatinine 0.7 - 1.3 mg/dL 1.1 1.1 1.1  Sodium 136 - 145 mEq/L 142 142 142  Potassium 3.5 - 5.1 mEq/L 4.3 4.2 3.9  Chloride 101 - 111 mmol/L - - -  CO2 22 - 29 mEq/L 27 27 27   Calcium 8.4 - 10.4 mg/dL 9.4 9.7 9.1  Total Protein 6.4 - 8.3 g/dL 7.0 8.0 7.5  Total Bilirubin 0.20 - 1.20 mg/dL 0.30 0.39 0.41  Alkaline Phos 40 - 150 U/L 74 84 75  AST 5 -  34 U/L 21 25 222(HH)  ALT 0 - 55 U/L 30 40 133(H)   Component     Latest Ref Rng & Units 07/27/2016  Sed Rate     0 - 15 mm/hr 43 (H)  LDH     125 - 245 U/L 157  Hep C Virus Ab     0.0 - 0.9 s/co ratio <0.1  Hepatitis B Surface Ag     Negative Negative      RADIOGRAPHIC STUDIES: I have personally reviewed the radiological images as listed and agreed with the findings in the report.  .No results found.     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 Stage IVA as per PET/CT scan suggestive of bone marrow involvement. Sedimentation rate 43 (on diagnosis) ECHO nl EF PFTs WNL Port placed Chem0-counseling done and consent obtained.  #2 Elevated transaminases after C1D1 treatment - now normalized. Tolerated cycle 1 day 15 with labs today completely stable with no reevaluation of his liver function tests  #3 grade 1 fatigue - now resolved  #4 grade 1 nausea. Plan -Labs today completely normal -We shall  proceed with cycle 2 day 1 of ABVD without any dose reductions -he was recommended to continue low-impact activity without excessive exertion or doing heavy weights.  -recommended complete avoidance of alcohol and other potential hepatotoxic medications. -Chemotherapy as per schedule. -Continue Zofran when necessary for nausea. If significant and recurrent might need to extend dexamethasone for an additional couple of days. Patient was recommended to schedule Zofran for a couple of days on day 4 and day 5 in the morning.  RTC in 2 weeks with Dr Irene Limbo with labs on C2D15 of treatment . Earlier if any new concerns.  I spent 20 minutes counseling the patient face to face. The total time spent in the appointment was 20 minutes and more than 50% was on counseling and direct patient cares.    Sullivan Lone MD [place] AAHIVMS [facility] Hematology/Oncology Physician [facility]  (Office):       [phone] (Work cell):  [phone] (Fax):           [phone]

## 2016-10-07 ENCOUNTER — Encounter: Payer: Self-pay | Admitting: Hematology

## 2016-10-07 ENCOUNTER — Other Ambulatory Visit (HOSPITAL_BASED_OUTPATIENT_CLINIC_OR_DEPARTMENT_OTHER): Payer: Commercial Managed Care - HMO

## 2016-10-07 ENCOUNTER — Ambulatory Visit (HOSPITAL_BASED_OUTPATIENT_CLINIC_OR_DEPARTMENT_OTHER): Payer: Commercial Managed Care - HMO | Admitting: Hematology

## 2016-10-07 VITALS — BP 137/81 | HR 52 | Temp 97.7°F | Resp 18 | Ht 73.0 in | Wt 284.1 lb

## 2016-10-07 DIAGNOSIS — C8118 Nodular sclerosis classical Hodgkin lymphoma, lymph nodes of multiple sites: Secondary | ICD-10-CM

## 2016-10-07 DIAGNOSIS — R7989 Other specified abnormal findings of blood chemistry: Secondary | ICD-10-CM | POA: Diagnosis not present

## 2016-10-07 DIAGNOSIS — R110 Nausea: Secondary | ICD-10-CM

## 2016-10-07 DIAGNOSIS — R945 Abnormal results of liver function studies: Secondary | ICD-10-CM

## 2016-10-07 LAB — COMPREHENSIVE METABOLIC PANEL
ALT: 32 U/L (ref 0–55)
AST: 24 U/L (ref 5–34)
Albumin: 3.5 g/dL (ref 3.5–5.0)
Alkaline Phosphatase: 58 U/L (ref 40–150)
Anion Gap: 8 mEq/L (ref 3–11)
BILIRUBIN TOTAL: 0.41 mg/dL (ref 0.20–1.20)
BUN: 15.3 mg/dL (ref 7.0–26.0)
CHLORIDE: 107 meq/L (ref 98–109)
CO2: 27 meq/L (ref 22–29)
Calcium: 9.1 mg/dL (ref 8.4–10.4)
Creatinine: 1.1 mg/dL (ref 0.7–1.3)
GLUCOSE: 94 mg/dL (ref 70–140)
Potassium: 4.4 mEq/L (ref 3.5–5.1)
SODIUM: 141 meq/L (ref 136–145)
TOTAL PROTEIN: 6.6 g/dL (ref 6.4–8.3)

## 2016-10-07 LAB — CBC WITH DIFFERENTIAL/PLATELET
BASO%: 1.1 % (ref 0.0–2.0)
Basophils Absolute: 0.1 10*3/uL (ref 0.0–0.1)
EOS%: 4.3 % (ref 0.0–7.0)
Eosinophils Absolute: 0.2 10*3/uL (ref 0.0–0.5)
HCT: 39.7 % (ref 38.4–49.9)
HGB: 12.9 g/dL — ABNORMAL LOW (ref 13.0–17.1)
LYMPH%: 30.8 % (ref 14.0–49.0)
MCH: 27.3 pg (ref 27.2–33.4)
MCHC: 32.4 g/dL (ref 32.0–36.0)
MCV: 84 fL (ref 79.3–98.0)
MONO#: 0.7 10*3/uL (ref 0.1–0.9)
MONO%: 14.9 % — AB (ref 0.0–14.0)
NEUT%: 48.9 % (ref 39.0–75.0)
NEUTROS ABS: 2.2 10*3/uL (ref 1.5–6.5)
Platelets: 186 10*3/uL (ref 140–400)
RBC: 4.72 10*6/uL (ref 4.20–5.82)
RDW: 15.3 % — ABNORMAL HIGH (ref 11.0–14.6)
WBC: 4.4 10*3/uL (ref 4.0–10.3)
lymph#: 1.4 10*3/uL (ref 0.9–3.3)

## 2016-10-08 ENCOUNTER — Ambulatory Visit (HOSPITAL_BASED_OUTPATIENT_CLINIC_OR_DEPARTMENT_OTHER): Payer: Commercial Managed Care - HMO

## 2016-10-08 VITALS — BP 140/84 | HR 56 | Temp 98.9°F | Resp 18

## 2016-10-08 DIAGNOSIS — Z5111 Encounter for antineoplastic chemotherapy: Secondary | ICD-10-CM | POA: Diagnosis not present

## 2016-10-08 DIAGNOSIS — C8118 Nodular sclerosis classical Hodgkin lymphoma, lymph nodes of multiple sites: Secondary | ICD-10-CM | POA: Diagnosis not present

## 2016-10-08 LAB — SEDIMENTATION RATE: SED RATE: 3 mm/h (ref 0–15)

## 2016-10-08 MED ORDER — SODIUM CHLORIDE 0.9 % IV SOLN
Freq: Once | INTRAVENOUS | Status: AC
  Administered 2016-10-08: 09:00:00 via INTRAVENOUS

## 2016-10-08 MED ORDER — VINBLASTINE SULFATE CHEMO INJECTION 1 MG/ML
15.0000 mg | Freq: Once | INTRAVENOUS | Status: AC
  Administered 2016-10-08: 15 mg via INTRAVENOUS
  Filled 2016-10-08: qty 10

## 2016-10-08 MED ORDER — SODIUM CHLORIDE 0.9% FLUSH
10.0000 mL | INTRAVENOUS | Status: DC | PRN
  Administered 2016-10-08: 10 mL
  Filled 2016-10-08: qty 10

## 2016-10-08 MED ORDER — SODIUM CHLORIDE 0.9 % IV SOLN
Freq: Once | INTRAVENOUS | Status: AC
  Administered 2016-10-08: 09:00:00 via INTRAVENOUS
  Filled 2016-10-08: qty 5

## 2016-10-08 MED ORDER — SODIUM CHLORIDE 0.9 % IV SOLN
375.0000 mg/m2 | Freq: Once | INTRAVENOUS | Status: AC
  Administered 2016-10-08: 940 mg via INTRAVENOUS
  Filled 2016-10-08: qty 47

## 2016-10-08 MED ORDER — HEPARIN SOD (PORK) LOCK FLUSH 100 UNIT/ML IV SOLN
500.0000 [IU] | Freq: Once | INTRAVENOUS | Status: AC | PRN
  Administered 2016-10-08: 500 [IU]
  Filled 2016-10-08: qty 5

## 2016-10-08 MED ORDER — SODIUM CHLORIDE 0.9 % IV SOLN
10.0000 [IU]/m2 | Freq: Once | INTRAVENOUS | Status: AC
  Administered 2016-10-08: 25 [IU] via INTRAVENOUS
  Filled 2016-10-08: qty 8.33

## 2016-10-08 MED ORDER — PALONOSETRON HCL INJECTION 0.25 MG/5ML
0.2500 mg | Freq: Once | INTRAVENOUS | Status: AC
  Administered 2016-10-08: 0.25 mg via INTRAVENOUS

## 2016-10-08 MED ORDER — DOXORUBICIN HCL CHEMO IV INJECTION 2 MG/ML
25.0000 mg/m2 | Freq: Once | INTRAVENOUS | Status: AC
Start: 2016-10-08 — End: 2016-10-08
  Administered 2016-10-08: 62 mg via INTRAVENOUS
  Filled 2016-10-08: qty 31

## 2016-10-08 MED ORDER — PALONOSETRON HCL INJECTION 0.25 MG/5ML
INTRAVENOUS | Status: AC
  Filled 2016-10-08: qty 5

## 2016-10-08 NOTE — Progress Notes (Signed)
Pushed Adriamycin over 15 minutes.  Positive blood return before and during blood return was slight, stopped infusion let line run clean and rechecked with saline syringe for positive blood and resumed. Blood assessed at end.

## 2016-10-08 NOTE — Patient Instructions (Signed)
[facility] Discharge Instructions for Patients Receiving Chemotherapy  Today you received the following chemotherapy agents:  Adriamycin, Vinblastine, Bleomycin, and DTIC.  To help prevent nausea and vomiting after your treatment, we encourage you to take your nausea medication as directed.   If you develop nausea and vomiting that is not controlled by your nausea medication, call the clinic.   BELOW ARE SYMPTOMS THAT SHOULD BE REPORTED IMMEDIATELY:  *FEVER GREATER THAN 100.5 F  *CHILLS WITH OR WITHOUT FEVER  NAUSEA AND VOMITING THAT IS NOT CONTROLLED WITH YOUR NAUSEA MEDICATION  *UNUSUAL SHORTNESS OF BREATH  *UNUSUAL BRUISING OR BLEEDING  TENDERNESS IN MOUTH AND THROAT WITH OR WITHOUT PRESENCE OF ULCERS  *URINARY PROBLEMS  *BOWEL PROBLEMS  UNUSUAL RASH Items with * indicate a potential emergency and should be followed up as soon as possible.  Feel free to call the clinic you have any questions or concerns. The clinic phone number is [phone].  Please show the [place] at check-in to the Emergency Department and triage nurse.

## 2016-10-11 NOTE — Progress Notes (Signed)
[name]    HEMATOLOGY/ONCOLOGY CLINIC NOTE  Date of Service: .10/07/2016  Patient Care Team: No Pcp Per Patient as PCP - General (General Practice)  CHIEF COMPLAINTS/PURPOSE OF CONSULTATION:  Newly diagnosed classical [name]   HISTORY OF PRESENTING ILLNESS:   Craig Schmitt is a wonderful 30 y.o. male who has been referred to [place] by Dr .Jackolyn Confer MD for evaluation and management of newly diagnosed classical [name].  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 The lymph nodes in his neck were progressively growing and he noted some enlarged lymph nodes under his left armpit  and therefore sought additional attention.   He had a CT of the neck  on 05/24/2016 that showed multiple enlarged lymph nodes in the left neck concerning for possible lymphoma. No pharyngeal masses were noted. CT of the chest/Abd done the same day showed left posterior cervical, supraclavicular, left axillary, mediastinal and right hilar lymphadenopathy highly suspicious for lymphoma. No findings suspicious for lymphoma beneath the diaphragm. Spleen was normal in size.  Patient was seen by Dr. Jackolyn Confer had a left cervical lymph node biopsy on 07/20/2016 that showed classical [name], nodular sclerosis type.  Patient reports no fevers no chills no night sweats no weight loss no fatigue. No change in his breathing. No chest pain. No abdominal pain or discomfort. No change in bowel habits. No headaches or focal neurological deficits.  He is here for his clinic visit with his brother for discussion of his diagnosis. He was not aware of the final pathologic diagnosis till we discussed it in clinic today.   INTERVAL HISTORY  Craig Schmitt is here for followup prior to C2D15 of ABVD. He notes some grade 1 fatigue which was  especially pronounced after he worked out more than usual. He was asked to take it easy with his high-intensity workouts while on chemotherapy. Liver function tests remain stable and labs today. No fevers no chills no night sweats. 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 Mild grade 1 nausea controlled with medications. No other acute symptoms at this time .   MEDICAL HISTORY:  1)Hives to pollen  SURGICAL HISTORY: Past Surgical History:  Procedure Laterality Date  . IR GENERIC HISTORICAL  08/02/2016   IR FLUORO GUIDE PORT INSERTION RIGHT 08/02/2016 [name], MD [facility]  . IR GENERIC HISTORICAL  08/02/2016   IR US GUIDE VASC ACCESS RIGHT 08/02/2016 [name], MD [facility]  . KNEE ARTHROSCOPY    . LYMPH GLAND EXCISION Left 07/20/2016   Procedure: LEFT CERVICAL LYMPH NODE BIOPSY;  Surgeon: Jackolyn Confer, MD;  Location: [facility];  Service: General;  Laterality: Left;    SOCIAL HISTORY: Social History   Social History  . Marital status: Single    Spouse name: N/A  . Number of children: N/A  . Years of education: N/A   Occupational History  . Not on file.   Social History Main Topics  . Smoking status: Never Smoker  . Smokeless tobacco: Never Used  . Alcohol use No  . Drug use: No  . Sexual activity: Not on file   Other Topics Concern  . Not on file   Social History Narrative  . No narrative on file  Never smoker   no significant alcohol use  FAMILY HISTORY: family history of hypertension  No known family history of blood disorders or cancer .  ALLERGIES:  has No Known Allergies.  Allergic to pollen no other known drug or food allergies .  MEDICATIONS:  Current Outpatient Prescriptions  Medication Sig Dispense Refill  . dexamethasone (DECADRON) 4 MG tablet Take 2 tablets by mouth once a day on the day after chemotherapy and then  take 2 tablets two times a day for 2 days. Take with food. 30 tablet 1  . ibuprofen (ADVIL,MOTRIN) 200 MG tablet Take 200 mg by mouth every 6 (six) hours as needed for mild pain.    [name] lidocaine-prilocaine (EMLA) cream Apply to affected area once 30 g 3  . ondansetron (ZOFRAN) 8 MG tablet Take 1 tablet (8 mg total) by mouth 2 (two) times daily as needed. Start on the third day after chemotherapy. 30 tablet 1  . prochlorperazine (COMPAZINE) 10 MG tablet Take 1 tablet (10 mg total) by mouth every 6 (six) hours as needed (Nausea or vomiting). 30 tablet 1   No current facility-administered medications for this visit.     REVIEW OF SYSTEMS:    10 Point review of Systems was done is negative except as noted above.  PHYSICAL EXAMINATION: ECOG PERFORMANCE STATUS: 0 - Asymptomatic  . Vitals:   10/07/16 0931  BP: 137/81  Pulse: (!) 52  Resp: 18  Temp: 97.7 F (36.5 C)   Filed Weights   10/07/16 0931  Weight: 284 lb 1.6 oz (128.9 kg)   .Body mass index is 37.48 kg/m. . Wt Readings from Last 3 Encounters:  10/07/16 284 lb 1.6 oz (128.9 kg)  09/21/16 285 lb 11.2 oz (129.6 kg)  09/10/16 271 lb 11.2 oz (123.2 kg)   GENERAL:alert, well-built African-American gentleman in no acute distress and comfortable SKIN: skin color, texture, turgor are normal, no rashes or significant lesions EYES: normal, conjunctiva are pink and non-injected, sclera clear OROPHARYNX:no exudate, no erythema and lips, buccal mucosa, and tongue normal  NECK: supple, no JVD, thyroid normal size, non-tender, without nodularity LYMPH:  Palpable left cervical , left supraclavicular and left axillary lymph nodes .no palpable inguinal lymph nodes.  LUNGS: clear to auscultation with normal respiratory effort HEART: regular rate & rhythm,  no murmurs and no lower extremity edema ABDOMEN: abdomen soft, non-tender, normoactive bowel sounds , no palpable hepatosplenomegaly . Musculoskeletal: no cyanosis of digits and no  clubbing  PSYCH: alert & oriented x 3 with fluent speech NEURO: no focal motor/sensory deficits  LABORATORY DATA:  I have reviewed the data as listed  CBC Latest Ref Rng & Units 10/07/2016 09/21/2016 09/10/2016  WBC 4.0 - 10.3 10e3/uL 4.4 5.8 9.6  Hemoglobin 13.0 - 17.1 g/dL 12.9(L) 13.5 14.5  Hematocrit 38.4 - 49.9 % 39.7 42.3 44.4  Platelets 140 - 400 10e3/uL 186 146 171    . CMP Latest Ref Rng & Units 10/07/2016 09/21/2016 09/10/2016  Glucose 70 - 140 mg/dl 94 87 70  BUN 7.0 - 26.0 mg/dL 15.3 15.5 14.9  Creatinine 0.7 - 1.3 mg/dL 1.1 1.1 1.1  Sodium 136 - 145 mEq/L 141 142 142  Potassium 3.5 - 5.1 mEq/L 4.4 4.3 4.2  Chloride 101 - 111 mmol/L - - -  CO2 22 - 29 mEq/L 27 27 27   Calcium 8.4 - 10.4 mg/dL 9.1 9.4 9.7  Total Protein 6.4 - 8.3 g/dL 6.6 7.0 8.0  Total Bilirubin 0.20 - 1.20 mg/dL 0.41 0.30  0.39  Alkaline Phos 40 - 150 U/L 58 74 84  AST 5 - 34 U/L 24 21 25   ALT 0 - 55 U/L 32 30 40     Component     Latest Ref Rng & Units 07/27/2016  Sed Rate     0 - 15 mm/hr 43 (H)  LDH     125 - 245 U/L 157  Hep C Virus Ab     0.0 - 0.9 s/co ratio <0.1  Hepatitis B Surface Ag     Negative Negative      RADIOGRAPHIC STUDIES: I have personally reviewed the radiological images as listed and agreed with the findings in the report.  .No results found.     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 Stage IVA as per PET/CT scan suggestive of bone marrow involvement. Sedimentation rate 43 (on diagnosis) - Now normalized down to 3 with treatment ECHO nl EF PFTs WNL Port in Situ.  #2 Elevated transaminases after C1D1 treatment - now normalized  and has remained normal on subsequent checks.   #3 grade 1 fatigue - now resolved  #4 grade 1 nausea. Controlled with medications  Plan -Labs today completely normal. No  prohibitive toxicities from chemotherapy at this time. -We shall proceed with cycle 2 day 15 of ABVD. -he was recommended to continue low-impact activity without excessive exertion or doing heavy weights.  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 He notes that this involves less exertion than his usual workouts . -recommended complete avoidance of alcohol and other potential hepatotoxic medications. -Chemotherapy as per schedule  every 2 weeks . -Continue Zofran when necessary for nausea. If significant and recurrent might need to extend dexamethasone for an additional couple of days. Patient was recommended to schedule Zofran for a couple of days on day 4 and day 5 in the morning. -PET/CT scan prior to cycle 4 of ABVD to assess response to treatment and plan additional treatment strategy.  -Continue ABVD chemotherapy as per scheduled. Next dose 2 weeks after today. -Labs every 2 weeks prior to chemotherapy -Return to clinic with Dr. Irene Limbo in 4 weeks on day of cycle 3 day 15. -PET/CT scan for reevaluation of [name] 1 week prior   I spent 20 minutes counseling the patient face to face. The total time spent in the appointment was 20 minutes and more than 50% was on counseling and direct patient cares.    Sullivan Lone MD [place] AAHIVMS [facility] Hematology/Oncology Physician [facility]  (Office):       [phone] (Work cell):  [phone] (Fax):           [phone]

## 2016-10-20 ENCOUNTER — Telehealth: Payer: Self-pay | Admitting: Hematology

## 2016-10-20 NOTE — Telephone Encounter (Signed)
Left message for patient re 12/22 and 1/5 appointments.

## 2016-10-22 ENCOUNTER — Ambulatory Visit (HOSPITAL_BASED_OUTPATIENT_CLINIC_OR_DEPARTMENT_OTHER): Payer: Commercial Managed Care - HMO

## 2016-10-22 ENCOUNTER — Other Ambulatory Visit (HOSPITAL_BASED_OUTPATIENT_CLINIC_OR_DEPARTMENT_OTHER): Payer: Commercial Managed Care - HMO

## 2016-10-22 ENCOUNTER — Other Ambulatory Visit: Payer: Self-pay

## 2016-10-22 VITALS — BP 138/73 | HR 60 | Temp 97.5°F | Resp 18

## 2016-10-22 DIAGNOSIS — C8118 Nodular sclerosis classical Hodgkin lymphoma, lymph nodes of multiple sites: Secondary | ICD-10-CM

## 2016-10-22 DIAGNOSIS — Z5111 Encounter for antineoplastic chemotherapy: Secondary | ICD-10-CM | POA: Diagnosis not present

## 2016-10-22 LAB — CBC WITH DIFFERENTIAL/PLATELET
BASO%: 0.4 % (ref 0.0–2.0)
Basophils Absolute: 0 10*3/uL (ref 0.0–0.1)
EOS%: 3.8 % (ref 0.0–7.0)
Eosinophils Absolute: 0.2 10*3/uL (ref 0.0–0.5)
HEMATOCRIT: 40.1 % (ref 38.4–49.9)
HGB: 13.2 g/dL (ref 13.0–17.1)
LYMPH#: 1.6 10*3/uL (ref 0.9–3.3)
LYMPH%: 34.9 % (ref 14.0–49.0)
MCH: 27.7 pg (ref 27.2–33.4)
MCHC: 32.9 g/dL (ref 32.0–36.0)
MCV: 84.2 fL (ref 79.3–98.0)
MONO#: 0.7 10*3/uL (ref 0.1–0.9)
MONO%: 13.8 % (ref 0.0–14.0)
NEUT%: 47.1 % (ref 39.0–75.0)
NEUTROS ABS: 2.2 10*3/uL (ref 1.5–6.5)
Platelets: 154 10*3/uL (ref 140–400)
RBC: 4.76 10*6/uL (ref 4.20–5.82)
RDW: 14.9 % — ABNORMAL HIGH (ref 11.0–14.6)
WBC: 4.7 10*3/uL (ref 4.0–10.3)

## 2016-10-22 LAB — COMPREHENSIVE METABOLIC PANEL
ALT: 42 U/L (ref 0–55)
AST: 26 U/L (ref 5–34)
Albumin: 3.8 g/dL (ref 3.5–5.0)
Alkaline Phosphatase: 69 U/L (ref 40–150)
Anion Gap: 7 mEq/L (ref 3–11)
BUN: 17.6 mg/dL (ref 7.0–26.0)
CHLORIDE: 109 meq/L (ref 98–109)
CO2: 25 meq/L (ref 22–29)
CREATININE: 1.2 mg/dL (ref 0.7–1.3)
Calcium: 9.2 mg/dL (ref 8.4–10.4)
EGFR: >90 mL/min/{1.73_m2} (ref >90)
Glucose: 93 mg/dl (ref 70–140)
Potassium: 4.3 mEq/L (ref 3.5–5.1)
Sodium: 141 mEq/L (ref 136–145)
Total Bilirubin: 0.27 mg/dL (ref 0.20–1.20)
Total Protein: 6.9 g/dL (ref 6.4–8.3)

## 2016-10-22 MED ORDER — HEPARIN SOD (PORK) LOCK FLUSH 100 UNIT/ML IV SOLN
500.0000 [IU] | Freq: Once | INTRAVENOUS | Status: AC | PRN
  Administered 2016-10-22: 500 [IU]
  Filled 2016-10-22: qty 5

## 2016-10-22 MED ORDER — SODIUM CHLORIDE 0.9% FLUSH
10.0000 mL | INTRAVENOUS | Status: DC | PRN
  Administered 2016-10-22: 10 mL
  Filled 2016-10-22: qty 10

## 2016-10-22 MED ORDER — SODIUM CHLORIDE 0.9 % IV SOLN
Freq: Once | INTRAVENOUS | Status: AC
  Administered 2016-10-22: 13:00:00 via INTRAVENOUS

## 2016-10-22 MED ORDER — PALONOSETRON HCL INJECTION 0.25 MG/5ML
0.2500 mg | Freq: Once | INTRAVENOUS | Status: AC
  Administered 2016-10-22: 0.25 mg via INTRAVENOUS

## 2016-10-22 MED ORDER — PALONOSETRON HCL INJECTION 0.25 MG/5ML
INTRAVENOUS | Status: AC
  Filled 2016-10-22: qty 5

## 2016-10-22 MED ORDER — SODIUM CHLORIDE 0.9 % IV SOLN
Freq: Once | INTRAVENOUS | Status: AC
  Administered 2016-10-22: 13:00:00 via INTRAVENOUS
  Filled 2016-10-22: qty 5

## 2016-10-22 MED ORDER — VINBLASTINE SULFATE CHEMO INJECTION 1 MG/ML
15.0000 mg | Freq: Once | INTRAVENOUS | Status: AC
  Administered 2016-10-22: 15 mg via INTRAVENOUS
  Filled 2016-10-22: qty 15

## 2016-10-22 MED ORDER — BLEOMYCIN SULFATE CHEMO INJECTION 30 UNIT
10.0000 [IU]/m2 | Freq: Once | INTRAMUSCULAR | Status: AC
  Administered 2016-10-22: 25 [IU] via INTRAVENOUS
  Filled 2016-10-22: qty 8.33

## 2016-10-22 MED ORDER — DOXORUBICIN HCL CHEMO IV INJECTION 2 MG/ML
25.0000 mg/m2 | Freq: Once | INTRAVENOUS | Status: AC
  Administered 2016-10-22: 62 mg via INTRAVENOUS
  Filled 2016-10-22: qty 31

## 2016-10-22 MED ORDER — SODIUM CHLORIDE 0.9 % IV SOLN
375.0000 mg/m2 | Freq: Once | INTRAVENOUS | Status: AC
  Administered 2016-10-22: 940 mg via INTRAVENOUS
  Filled 2016-10-22: qty 47

## 2016-10-22 NOTE — Patient Instructions (Signed)
[facility] Discharge Instructions for Patients Receiving Chemotherapy  Today you received the following chemotherapy agents; Adriamycin, Velban, Bleocin, DTIC  To help prevent nausea and vomiting after your treatment, we encourage you to take your nausea medication as directed.    If you develop nausea and vomiting that is not controlled by your nausea medication, call the clinic.   BELOW ARE SYMPTOMS THAT SHOULD BE REPORTED IMMEDIATELY:  *FEVER GREATER THAN 100.5 F  *CHILLS WITH OR WITHOUT FEVER  NAUSEA AND VOMITING THAT IS NOT CONTROLLED WITH YOUR NAUSEA MEDICATION  *UNUSUAL SHORTNESS OF BREATH  *UNUSUAL BRUISING OR BLEEDING  TENDERNESS IN MOUTH AND THROAT WITH OR WITHOUT PRESENCE OF ULCERS  *URINARY PROBLEMS  *BOWEL PROBLEMS  UNUSUAL RASH Items with * indicate a potential emergency and should be followed up as soon as possible.  Feel free to call the clinic you have any questions or concerns. The clinic phone number is [phone].  Please show the [place] at check-in to the Emergency Department and triage nurse.

## 2016-11-05 ENCOUNTER — Ambulatory Visit (HOSPITAL_BASED_OUTPATIENT_CLINIC_OR_DEPARTMENT_OTHER): Payer: Commercial Managed Care - HMO

## 2016-11-05 ENCOUNTER — Other Ambulatory Visit: Payer: Self-pay | Admitting: *Deleted

## 2016-11-05 ENCOUNTER — Ambulatory Visit (HOSPITAL_BASED_OUTPATIENT_CLINIC_OR_DEPARTMENT_OTHER): Payer: Commercial Managed Care - HMO | Admitting: Hematology

## 2016-11-05 ENCOUNTER — Encounter: Payer: Self-pay | Admitting: Hematology

## 2016-11-05 ENCOUNTER — Other Ambulatory Visit (HOSPITAL_BASED_OUTPATIENT_CLINIC_OR_DEPARTMENT_OTHER): Payer: Commercial Managed Care - HMO

## 2016-11-05 VITALS — BP 135/53 | HR 54 | Temp 97.7°F | Resp 18 | Ht 73.0 in | Wt 292.5 lb

## 2016-11-05 DIAGNOSIS — R110 Nausea: Secondary | ICD-10-CM | POA: Diagnosis not present

## 2016-11-05 DIAGNOSIS — C8118 Nodular sclerosis classical Hodgkin lymphoma, lymph nodes of multiple sites: Secondary | ICD-10-CM

## 2016-11-05 DIAGNOSIS — Z5111 Encounter for antineoplastic chemotherapy: Secondary | ICD-10-CM | POA: Diagnosis not present

## 2016-11-05 LAB — CBC WITH DIFFERENTIAL/PLATELET
BASO%: 0.6 % (ref 0.0–2.0)
BASOS ABS: 0 10*3/uL (ref 0.0–0.1)
EOS%: 5.4 % (ref 0.0–7.0)
Eosinophils Absolute: 0.3 10*3/uL (ref 0.0–0.5)
HEMATOCRIT: 41.4 % (ref 38.4–49.9)
HGB: 13.6 g/dL (ref 13.0–17.1)
LYMPH#: 1.5 10*3/uL (ref 0.9–3.3)
LYMPH%: 29.8 % (ref 14.0–49.0)
MCH: 27.9 pg (ref 27.2–33.4)
MCHC: 32.9 g/dL (ref 32.0–36.0)
MCV: 85 fL (ref 79.3–98.0)
MONO#: 1 10*3/uL — AB (ref 0.1–0.9)
MONO%: 19.3 % — ABNORMAL HIGH (ref 0.0–14.0)
NEUT#: 2.3 10*3/uL (ref 1.5–6.5)
NEUT%: 44.9 % (ref 39.0–75.0)
PLATELETS: 161 10*3/uL (ref 140–400)
RBC: 4.87 10*6/uL (ref 4.20–5.82)
RDW: 14.9 % — ABNORMAL HIGH (ref 11.0–14.6)
WBC: 5.1 10*3/uL (ref 4.0–10.3)

## 2016-11-05 LAB — COMPREHENSIVE METABOLIC PANEL
ALT: 35 U/L (ref 0–55)
ANION GAP: 7 meq/L (ref 3–11)
AST: 23 U/L (ref 5–34)
Albumin: 3.8 g/dL (ref 3.5–5.0)
Alkaline Phosphatase: 62 U/L (ref 40–150)
BUN: 17.5 mg/dL (ref 7.0–26.0)
CALCIUM: 9.6 mg/dL (ref 8.4–10.4)
CHLORIDE: 105 meq/L (ref 98–109)
CO2: 29 mEq/L (ref 22–29)
Creatinine: 1.2 mg/dL (ref 0.7–1.3)
Glucose: 89 mg/dl (ref 70–140)
POTASSIUM: 4.4 meq/L (ref 3.5–5.1)
Sodium: 141 mEq/L (ref 136–145)
Total Bilirubin: 0.37 mg/dL (ref 0.20–1.20)
Total Protein: 7 g/dL (ref 6.4–8.3)

## 2016-11-05 MED ORDER — PALONOSETRON HCL INJECTION 0.25 MG/5ML
0.2500 mg | Freq: Once | INTRAVENOUS | Status: AC
  Administered 2016-11-05: 0.25 mg via INTRAVENOUS

## 2016-11-05 MED ORDER — SODIUM CHLORIDE 0.9 % IV SOLN
10.0000 [IU]/m2 | Freq: Once | INTRAVENOUS | Status: AC
  Administered 2016-11-05: 25 [IU] via INTRAVENOUS
  Filled 2016-11-05: qty 8.33

## 2016-11-05 MED ORDER — VINBLASTINE SULFATE CHEMO INJECTION 1 MG/ML
6.0500 mg/m2 | Freq: Once | INTRAVENOUS | Status: AC
  Administered 2016-11-05: 15 mg via INTRAVENOUS
  Filled 2016-11-05: qty 15

## 2016-11-05 MED ORDER — SODIUM CHLORIDE 0.9 % IV SOLN
375.0000 mg/m2 | Freq: Once | INTRAVENOUS | Status: AC
  Administered 2016-11-05: 940 mg via INTRAVENOUS
  Filled 2016-11-05: qty 27

## 2016-11-05 MED ORDER — SODIUM CHLORIDE 0.9 % IV SOLN
Freq: Once | INTRAVENOUS | Status: AC
  Administered 2016-11-05: 13:00:00 via INTRAVENOUS
  Filled 2016-11-05: qty 5

## 2016-11-05 MED ORDER — DEXAMETHASONE 4 MG PO TABS: ORAL_TABLET | ORAL | 1 refills | Status: DC

## 2016-11-05 MED ORDER — PALONOSETRON HCL INJECTION 0.25 MG/5ML
INTRAVENOUS | Status: AC
  Filled 2016-11-05: qty 5

## 2016-11-05 MED ORDER — SODIUM CHLORIDE 0.9% FLUSH
10.0000 mL | INTRAVENOUS | Status: DC | PRN
  Administered 2016-11-05: 10 mL
  Filled 2016-11-05: qty 10

## 2016-11-05 MED ORDER — SODIUM CHLORIDE 0.9 % IV SOLN
Freq: Once | INTRAVENOUS | Status: AC
  Administered 2016-11-05: 12:00:00 via INTRAVENOUS

## 2016-11-05 MED ORDER — DOXORUBICIN HCL CHEMO IV INJECTION 2 MG/ML
25.0000 mg/m2 | Freq: Once | INTRAVENOUS | Status: AC
  Administered 2016-11-05: 62 mg via INTRAVENOUS
  Filled 2016-11-05: qty 31

## 2016-11-05 MED ORDER — HEPARIN SOD (PORK) LOCK FLUSH 100 UNIT/ML IV SOLN
500.0000 [IU] | Freq: Once | INTRAVENOUS | Status: AC | PRN
  Administered 2016-11-05: 500 [IU]
  Filled 2016-11-05: qty 5

## 2016-11-05 NOTE — Patient Instructions (Signed)
[facility] Discharge Instructions for Patients Receiving Chemotherapy  Today you received the following chemotherapy agents :  Adriamycin, Velban, Bleomycin, Dacarbazine.  To help prevent nausea and vomiting after your treatment, we encourage you to take your nausea medication as prescribed by your physician. Take Ondansetron on the 3rd day after chemo for nausea as needed.     If you develop nausea and vomiting that is not controlled by your nausea medication, call the clinic.   BELOW ARE SYMPTOMS THAT SHOULD BE REPORTED IMMEDIATELY:  *FEVER GREATER THAN 100.5 F  *CHILLS WITH OR WITHOUT FEVER  NAUSEA AND VOMITING THAT IS NOT CONTROLLED WITH YOUR NAUSEA MEDICATION  *UNUSUAL SHORTNESS OF BREATH  *UNUSUAL BRUISING OR BLEEDING  TENDERNESS IN MOUTH AND THROAT WITH OR WITHOUT PRESENCE OF ULCERS  *URINARY PROBLEMS  *BOWEL PROBLEMS  UNUSUAL RASH Items with * indicate a potential emergency and should be followed up as soon as possible.  Feel free to call the clinic you have any questions or concerns. The clinic phone number is [phone].  Please show the [place] at check-in to the Emergency Department and triage nurse.

## 2016-11-06 LAB — SEDIMENTATION RATE: SED RATE: 3 mm/h (ref 0–15)

## 2016-11-08 ENCOUNTER — Telehealth: Payer: Self-pay | Admitting: Hematology

## 2016-11-08 NOTE — Progress Notes (Signed)
Craig Schmitt    HEMATOLOGY/ONCOLOGY CLINIC NOTE  Date of Service: .11/05/2016  Patient Care Team: No Pcp Per Patient as PCP - General (General Practice)  CHIEF COMPLAINTS/PURPOSE OF CONSULTATION:  Newly diagnosed classical [name]   HISTORY OF PRESENTING ILLNESS:  Plz see previous note for details.  INTERVAL HISTORY  [name] is here for followup prior to C3D15 of ABVD. He notes no acute new symptoms. Notes that his lymph nodes have resolved. No fevers no chills, night sweats. No shortness of breath. Has gained some weight over the holidays. Has been trying to keep physically active.   MEDICAL HISTORY:  1)Hives to pollen  SURGICAL HISTORY: Past Surgical History:  Procedure Laterality Date  . IR GENERIC HISTORICAL  08/02/2016   IR FLUORO GUIDE PORT INSERTION RIGHT 08/02/2016 [name], MD [facility]  . IR GENERIC HISTORICAL  08/02/2016   IR US GUIDE VASC ACCESS RIGHT 08/02/2016 [name], MD [facility]  . KNEE ARTHROSCOPY    . LYMPH GLAND EXCISION Left 07/20/2016   Procedure: LEFT CERVICAL LYMPH NODE BIOPSY;  Surgeon: Jackolyn Confer, MD;  Location: [place];  Service: General;  Laterality: Left;    SOCIAL HISTORY: Social History   Social History  . Marital status: Single    Spouse name: N/A  . Number of children: N/A  . Years of education: N/A   Occupational History  . Not on file.   Social History Main Topics  . Smoking status: Never Smoker  . Smokeless tobacco: Never Used  . Alcohol use No  . Drug use: No  . Sexual activity: Not on file   Other Topics Concern  . Not on file   Social History Narrative  . No narrative on file  Never smoker   no significant alcohol use  FAMILY HISTORY: family history of hypertension  No known family history of blood disorders or cancer .  ALLERGIES:  has No Known Allergies.  Allergic to pollen no other known drug or food allergies .  MEDICATIONS:  Current Outpatient Prescriptions    Medication Sig Dispense Refill  . ibuprofen (ADVIL,MOTRIN) 200 MG tablet Take 200 mg by mouth every 6 (six) hours as needed for mild pain.    Craig Schmitt lidocaine-prilocaine (EMLA) cream Apply to affected area once 30 g 3  . ondansetron (ZOFRAN) 8 MG tablet Take 1 tablet (8 mg total) by mouth 2 (two) times daily as needed. Start on the third day after chemotherapy. 30 tablet 1  . prochlorperazine (COMPAZINE) 10 MG tablet Take 1 tablet (10 mg total) by mouth every 6 (six) hours as needed (Nausea or vomiting). 30 tablet 1  . dexamethasone (DECADRON) 4 MG tablet Take 2 tablets by mouth once a day on the day after chemotherapy and then take 2 tablets two times a day for 2 days. Take with food. 30 tablet 1   No current facility-administered medications for this visit.     REVIEW OF SYSTEMS:    10 Point review of Systems was done is negative except as noted above.  PHYSICAL EXAMINATION: ECOG PERFORMANCE STATUS: 0 - Asymptomatic  . Vitals:   11/05/16 1046  BP: (!) 135/53  Pulse: (!) 54  Resp: 18  Temp: 97.7 F (36.5 C)   Filed Weights   11/05/16 1046  Weight: 292 lb 8 oz (132.7 kg)   .Body mass index is 38.59 kg/m. . Wt Readings from Last 3 Encounters:  11/05/16 292 lb 8 oz (132.7 kg)  10/07/16 284 lb 1.6 oz (  128.9 kg)  09/21/16 285 lb 11.2 oz (129.6 kg)   GENERAL:alert, well-built African-American gentleman in no acute distress and comfortable SKIN: skin color, texture, turgor are normal, no rashes or significant lesions EYES: normal, conjunctiva are pink and non-injected, sclera clear OROPHARYNX:no exudate, no erythema and lips, buccal mucosa, and tongue normal  NECK: supple, no JVD, thyroid normal size, non-tender, without nodularity LYMPH:  Palpable left cervical , left supraclavicular and left axillary lymph nodes .no palpable inguinal lymph nodes.  LUNGS: clear to auscultation with normal respiratory effort HEART: regular rate & rhythm,  no murmurs and no lower extremity  edema ABDOMEN: abdomen soft, non-tender, normoactive bowel sounds , no palpable hepatosplenomegaly . Musculoskeletal: no cyanosis of digits and no clubbing  PSYCH: alert & oriented x 3 with fluent speech NEURO: no focal motor/sensory deficits  LABORATORY DATA:  I have reviewed the data as listed  CBC Latest Ref Rng & Units 11/05/2016 10/22/2016 10/07/2016  WBC 4.0 - 10.3 10e3/uL 5.1 4.7 4.4  Hemoglobin 13.0 - 17.1 g/dL 13.6 13.2 12.9(L)  Hematocrit 38.4 - 49.9 % 41.4 40.1 39.7  Platelets 140 - 400 10e3/uL 161 154 186   . CBC    Component Value Date/Time   WBC 5.1 11/05/2016 1033   WBC 12.7 (H) 08/02/2016 0800   RBC 4.87 11/05/2016 1033   RBC 4.93 08/02/2016 0800   HGB 13.6 11/05/2016 1033   HCT 41.4 11/05/2016 1033   PLT 161 11/05/2016 1033   MCV 85.0 11/05/2016 1033   MCH 27.9 11/05/2016 1033   MCH 27.2 08/02/2016 0800   MCHC 32.9 11/05/2016 1033   MCHC 33.9 08/02/2016 0800   RDW 14.9 (H) 11/05/2016 1033   LYMPHSABS 1.5 11/05/2016 1033   MONOABS 1.0 (H) 11/05/2016 1033   EOSABS 0.3 11/05/2016 1033   BASOSABS 0.0 11/05/2016 1033    . CMP Latest Ref Rng & Units 11/05/2016 10/22/2016 10/07/2016  Glucose 70 - 140 mg/dl 89 93 94  BUN 7.0 - 26.0 mg/dL 17.5 17.6 15.3  Creatinine 0.7 - 1.3 mg/dL 1.2 1.2 1.1  Sodium 136 - 145 mEq/L 141 141 141  Potassium 3.5 - 5.1 mEq/L 4.4 4.3 4.4  Chloride 101 - 111 mmol/L - - -  CO2 22 - 29 mEq/L 29 25 27   Calcium 8.4 - 10.4 mg/dL 9.6 9.2 9.1  Total Protein 6.4 - 8.3 g/dL 7.0 6.9 6.6  Total Bilirubin 0.20 - 1.20 mg/dL 0.37 0.27 0.41  Alkaline Phos 40 - 150 U/L 62 69 58  AST 5 - 34 U/L 23 26 24   ALT 0 - 55 U/L 35 42 32     RADIOGRAPHIC STUDIES: I have personally reviewed the radiological images as listed and agreed with the findings in the report.  .No results found.     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 Stage IVA as per PET/CT scan suggestive of bone marrow involvement. Sedimentation rate 43 (on diagnosis) - Now normalized down to 3 with treatment ECHO nl EF PFTs WNL Port in Situ.  #2 Elevated transaminases after C1D1 treatment - now normalized  and has remained normal on subsequent checks.   #3 grade 1 fatigue - now resolved  #4 grade 1 nausea. Controlled with medications  Plan -Labs today completely normal. No prohibitive toxicities from chemotherapy at this time. -We shall proceed with cycle 3 day 15 of ABVD. -recommended complete  avoidance of alcohol and other potential hepatotoxic medications. -Chemotherapy as per schedule  every 2 weeks . -Continue Zofran when necessary for nausea. If significant and recurrent might need to extend dexamethasone for an additional couple of days. Patient was recommended to schedule Zofran for a couple of days on day 4 and day 5 in the morning. -PET/CT scan prior to cycle 4 of ABVD to assess response to treatment and plan additional treatment strategy. Has been scheduled for 11/11/2016. -We'll repeat PFTs within the next 3-4 weeks for monitoring for subtle bleomycin toxicity. -Labs every 2 weeks prior to chemotherapy  Return to clinic with Dr. Irene Limbo in 4 weeks on day of cycle 4 day 15.  I spent 20 minutes counseling the patient face to face. The total time spent in the appointment was 20 minutes and more than 50% was on counseling and direct patient cares.    Sullivan Lone MD [place] AAHIVMS [facility] Hematology/Oncology Physician [facility]  (Office):       [phone] (Work cell):  [phone] (Fax):           [phone]

## 2016-11-08 NOTE — Telephone Encounter (Signed)
Spoke with patient re next appointment for 1/19 and 1/15 PFT. Patient to get new schedule 1/19.

## 2016-11-09 ENCOUNTER — Telehealth: Payer: Self-pay | Admitting: Hematology

## 2016-11-09 NOTE — Telephone Encounter (Signed)
Confirmed with patient that he had appointments on 1/19

## 2016-11-11 ENCOUNTER — Ambulatory Visit (HOSPITAL_COMMUNITY)
Admission: RE | Admit: 2016-11-11 | Discharge: 2016-11-11 | Disposition: A | Discharge status: Home / Self Care | Payer: Commercial Managed Care - HMO | Source: Ambulatory Visit | Attending: Hematology | Admitting: Hematology

## 2016-11-11 DIAGNOSIS — R911 Solitary pulmonary nodule: Secondary | ICD-10-CM | POA: Insufficient documentation

## 2016-11-11 DIAGNOSIS — C8118 Nodular sclerosis classical Hodgkin lymphoma, lymph nodes of multiple sites: Secondary | ICD-10-CM | POA: Diagnosis not present

## 2016-11-11 DIAGNOSIS — J320 Chronic maxillary sinusitis: Secondary | ICD-10-CM | POA: Diagnosis not present

## 2016-11-11 LAB — GLUCOSE, CAPILLARY: Glucose-Capillary: 88 mg/dL (ref 65–99)

## 2016-11-11 MED ORDER — FLUDEOXYGLUCOSE F - 18 (FDG) INJECTION: 14.5000 | Freq: Once | INTRAVENOUS | Status: DC | PRN

## 2016-11-15 ENCOUNTER — Ambulatory Visit (HOSPITAL_COMMUNITY)
Admission: RE | Admit: 2016-11-15 | Discharge: 2016-11-15 | Disposition: A | Discharge status: Home / Self Care | Payer: Commercial Managed Care - HMO | Source: Ambulatory Visit | Attending: Hematology | Admitting: Hematology

## 2016-11-15 DIAGNOSIS — C8118 Nodular sclerosis classical Hodgkin lymphoma, lymph nodes of multiple sites: Secondary | ICD-10-CM

## 2016-11-15 LAB — PULMONARY FUNCTION TEST
DL/VA % PRED: 90 %
DL/VA: 4.38 ml/min/mmHg/L
DLCO cor % pred: 74 %
DLCO cor: 27.18 ml/min/mmHg
DLCO unc % pred: 72 %
DLCO unc: 26.38 ml/min/mmHg
FEF 25-75 Pre: 4.36 L/sec
FEF2575-%PRED-PRE: 96 %
FEV1-%PRED-PRE: 100 %
FEV1-PRE: 4.26 L
FEV1FVC-%Pred-Pre: 97 %
FEV6-%PRED-PRE: 103 %
FEV6-PRE: 5.17 L
FEV6FVC-%Pred-Pre: 100 %
FVC-%PRED-PRE: 102 %
FVC-PRE: 5.17 L
PRE FEV1/FVC RATIO: 82 %
Pre FEV6/FVC Ratio: 100 %
RV % pred: 120 %
RV: 2.12 L
TLC % PRED: 95 %
TLC: 7.13 L

## 2016-11-15 MED ORDER — ALBUTEROL SULFATE (2.5 MG/3ML) 0.083% IN NEBU: 2.5000 mg | INHALATION_SOLUTION | Freq: Once | RESPIRATORY_TRACT | Status: DC

## 2016-11-17 ENCOUNTER — Other Ambulatory Visit: Payer: Self-pay | Admitting: Hematology

## 2016-11-19 ENCOUNTER — Other Ambulatory Visit (HOSPITAL_BASED_OUTPATIENT_CLINIC_OR_DEPARTMENT_OTHER): Payer: Commercial Managed Care - HMO

## 2016-11-19 ENCOUNTER — Ambulatory Visit (HOSPITAL_BASED_OUTPATIENT_CLINIC_OR_DEPARTMENT_OTHER): Payer: Commercial Managed Care - HMO

## 2016-11-19 VITALS — BP 129/52 | HR 65 | Temp 97.6°F | Resp 18

## 2016-11-19 DIAGNOSIS — C8118 Nodular sclerosis classical Hodgkin lymphoma, lymph nodes of multiple sites: Secondary | ICD-10-CM

## 2016-11-19 DIAGNOSIS — Z5111 Encounter for antineoplastic chemotherapy: Secondary | ICD-10-CM | POA: Diagnosis not present

## 2016-11-19 LAB — COMPREHENSIVE METABOLIC PANEL
ALBUMIN: 3.8 g/dL (ref 3.5–5.0)
ALT: 34 U/L (ref 0–55)
AST: 24 U/L (ref 5–34)
Alkaline Phosphatase: 57 U/L (ref 40–150)
Anion Gap: 9 mEq/L (ref 3–11)
BUN: 23.4 mg/dL (ref 7.0–26.0)
CALCIUM: 9.5 mg/dL (ref 8.4–10.4)
CO2: 25 mEq/L (ref 22–29)
CREATININE: 1.3 mg/dL (ref 0.7–1.3)
Chloride: 108 mEq/L (ref 98–109)
EGFR: 85 mL/min/{1.73_m2} — ABNORMAL LOW (ref >90)
GLUCOSE: 81 mg/dL (ref 70–140)
Potassium: 4.1 mEq/L (ref 3.5–5.1)
SODIUM: 142 meq/L (ref 136–145)
Total Bilirubin: 0.35 mg/dL (ref 0.20–1.20)
Total Protein: 7.1 g/dL (ref 6.4–8.3)

## 2016-11-19 LAB — CBC WITH DIFFERENTIAL/PLATELET
BASO%: 0.9 % (ref 0.0–2.0)
Basophils Absolute: 0 10*3/uL (ref 0.0–0.1)
EOS%: 2.6 % (ref 0.0–7.0)
Eosinophils Absolute: 0.2 10*3/uL (ref 0.0–0.5)
HEMATOCRIT: 40.7 % (ref 38.4–49.9)
HEMOGLOBIN: 13.4 g/dL (ref 13.0–17.1)
LYMPH#: 1.1 10*3/uL (ref 0.9–3.3)
LYMPH%: 19.2 % (ref 14.0–49.0)
MCH: 28.1 pg (ref 27.2–33.4)
MCHC: 33 g/dL (ref 32.0–36.0)
MCV: 85.3 fL (ref 79.3–98.0)
MONO#: 1 10*3/uL — ABNORMAL HIGH (ref 0.1–0.9)
MONO%: 17.1 % — ABNORMAL HIGH (ref 0.0–14.0)
NEUT%: 60.2 % (ref 39.0–75.0)
NEUTROS ABS: 3.5 10*3/uL (ref 1.5–6.5)
Platelets: 195 10*3/uL (ref 140–400)
RBC: 4.77 10*6/uL (ref 4.20–5.82)
RDW: 15.6 % — AB (ref 11.0–14.6)
WBC: 5.8 10*3/uL (ref 4.0–10.3)

## 2016-11-19 MED ORDER — VINBLASTINE SULFATE CHEMO INJECTION 1 MG/ML
6.0500 mg/m2 | Freq: Once | INTRAVENOUS | Status: AC
  Administered 2016-11-19: 15 mg via INTRAVENOUS
  Filled 2016-11-19: qty 15

## 2016-11-19 MED ORDER — DOXORUBICIN HCL CHEMO IV INJECTION 2 MG/ML
25.0000 mg/m2 | Freq: Once | INTRAVENOUS | Status: AC
  Administered 2016-11-19: 62 mg via INTRAVENOUS
  Filled 2016-11-19: qty 31

## 2016-11-19 MED ORDER — SODIUM CHLORIDE 0.9 % IV SOLN
375.0000 mg/m2 | Freq: Once | INTRAVENOUS | Status: AC
  Administered 2016-11-19: 940 mg via INTRAVENOUS
  Filled 2016-11-19: qty 47

## 2016-11-19 MED ORDER — SODIUM CHLORIDE 0.9 % IV SOLN
Freq: Once | INTRAVENOUS | Status: AC
  Administered 2016-11-19: 14:00:00 via INTRAVENOUS

## 2016-11-19 MED ORDER — PALONOSETRON HCL INJECTION 0.25 MG/5ML
0.2500 mg | Freq: Once | INTRAVENOUS | Status: AC
  Administered 2016-11-19: 0.25 mg via INTRAVENOUS

## 2016-11-19 MED ORDER — HEPARIN SOD (PORK) LOCK FLUSH 100 UNIT/ML IV SOLN
500.0000 [IU] | Freq: Once | INTRAVENOUS | Status: AC | PRN
  Administered 2016-11-19: 500 [IU]
  Filled 2016-11-19: qty 5

## 2016-11-19 MED ORDER — FOSAPREPITANT DIMEGLUMINE INJECTION 150 MG
Freq: Once | INTRAVENOUS | Status: AC
  Administered 2016-11-19: 14:00:00 via INTRAVENOUS
  Filled 2016-11-19: qty 5

## 2016-11-19 MED ORDER — SODIUM CHLORIDE 0.9% FLUSH
10.0000 mL | INTRAVENOUS | Status: DC | PRN
  Administered 2016-11-19: 10 mL
  Filled 2016-11-19: qty 10

## 2016-11-19 MED ORDER — PALONOSETRON HCL INJECTION 0.25 MG/5ML
INTRAVENOUS | Status: AC
  Filled 2016-11-19: qty 5

## 2016-11-19 MED ORDER — ALTEPLASE 2 MG IJ SOLR
2.0000 mg | Freq: Once | INTRAMUSCULAR | Status: AC | PRN
  Administered 2016-11-19: 2 mg
  Filled 2016-11-19: qty 2

## 2016-11-19 NOTE — Patient Instructions (Addendum)
[facility] Discharge Instructions for Patients Receiving Chemotherapy  Today you received the following chemotherapy agents :  Adriamycin, Velban, Dacarbazine.  To help prevent nausea and vomiting after your treatment, we encourage you to take your nausea medication as prescribed by your physician. Take Ondansetron on the 3rd day after chemo for nausea as needed.     If you develop nausea and vomiting that is not controlled by your nausea medication, call the clinic.   BELOW ARE SYMPTOMS THAT SHOULD BE REPORTED IMMEDIATELY:  *FEVER GREATER THAN 100.5 F  *CHILLS WITH OR WITHOUT FEVER  NAUSEA AND VOMITING THAT IS NOT CONTROLLED WITH YOUR NAUSEA MEDICATION  *UNUSUAL SHORTNESS OF BREATH  *UNUSUAL BRUISING OR BLEEDING  TENDERNESS IN MOUTH AND THROAT WITH OR WITHOUT PRESENCE OF ULCERS  *URINARY PROBLEMS  *BOWEL PROBLEMS  UNUSUAL RASH Items with * indicate a potential emergency and should be followed up as soon as possible.  Feel free to call the clinic you have any questions or concerns. The clinic phone number is [phone].  Please show the [place] at check-in to the Emergency Department and triage nurse.

## 2016-12-03 ENCOUNTER — Encounter: Payer: Self-pay | Admitting: Hematology

## 2016-12-03 ENCOUNTER — Telehealth: Payer: Self-pay | Admitting: Hematology

## 2016-12-03 ENCOUNTER — Ambulatory Visit (HOSPITAL_BASED_OUTPATIENT_CLINIC_OR_DEPARTMENT_OTHER): Payer: Commercial Managed Care - HMO

## 2016-12-03 ENCOUNTER — Ambulatory Visit (HOSPITAL_BASED_OUTPATIENT_CLINIC_OR_DEPARTMENT_OTHER): Payer: Commercial Managed Care - HMO | Admitting: Hematology

## 2016-12-03 ENCOUNTER — Other Ambulatory Visit (HOSPITAL_BASED_OUTPATIENT_CLINIC_OR_DEPARTMENT_OTHER): Payer: Commercial Managed Care - HMO

## 2016-12-03 ENCOUNTER — Telehealth: Payer: Self-pay | Admitting: *Deleted

## 2016-12-03 VITALS — BP 142/92 | HR 61 | Temp 98.6°F | Resp 18 | Ht 73.0 in | Wt 294.9 lb

## 2016-12-03 DIAGNOSIS — Z5111 Encounter for antineoplastic chemotherapy: Secondary | ICD-10-CM | POA: Diagnosis not present

## 2016-12-03 DIAGNOSIS — C8118 Nodular sclerosis classical Hodgkin lymphoma, lymph nodes of multiple sites: Secondary | ICD-10-CM

## 2016-12-03 DIAGNOSIS — R110 Nausea: Secondary | ICD-10-CM | POA: Diagnosis not present

## 2016-12-03 LAB — COMPREHENSIVE METABOLIC PANEL
ALT: 29 U/L (ref 0–55)
AST: 21 U/L (ref 5–34)
Albumin: 3.6 g/dL (ref 3.5–5.0)
Alkaline Phosphatase: 61 U/L (ref 40–150)
Anion Gap: 8 mEq/L (ref 3–11)
BUN: 16.6 mg/dL (ref 7.0–26.0)
CHLORIDE: 110 meq/L — AB (ref 98–109)
CO2: 25 mEq/L (ref 22–29)
Calcium: 9.4 mg/dL (ref 8.4–10.4)
Creatinine: 1.2 mg/dL (ref 0.7–1.3)
EGFR: >90 mL/min/{1.73_m2} (ref >90)
GLUCOSE: 78 mg/dL (ref 70–140)
POTASSIUM: 4.1 meq/L (ref 3.5–5.1)
SODIUM: 142 meq/L (ref 136–145)
Total Bilirubin: 0.32 mg/dL (ref 0.20–1.20)
Total Protein: 6.7 g/dL (ref 6.4–8.3)

## 2016-12-03 LAB — CBC WITH DIFFERENTIAL/PLATELET
BASO%: 0.8 % (ref 0.0–2.0)
BASOS ABS: 0 10*3/uL (ref 0.0–0.1)
EOS%: 3.2 % (ref 0.0–7.0)
Eosinophils Absolute: 0.2 10*3/uL (ref 0.0–0.5)
HCT: 40.1 % (ref 38.4–49.9)
HEMOGLOBIN: 13.4 g/dL (ref 13.0–17.1)
LYMPH%: 24.2 % (ref 14.0–49.0)
MCH: 28.4 pg (ref 27.2–33.4)
MCHC: 33.4 g/dL (ref 32.0–36.0)
MCV: 85.2 fL (ref 79.3–98.0)
MONO#: 1.1 10*3/uL — ABNORMAL HIGH (ref 0.1–0.9)
MONO%: 18.5 % — ABNORMAL HIGH (ref 0.0–14.0)
NEUT#: 3.1 10*3/uL (ref 1.5–6.5)
NEUT%: 53.3 % (ref 39.0–75.0)
Platelets: 192 10*3/uL (ref 140–400)
RBC: 4.71 10*6/uL (ref 4.20–5.82)
RDW: 15.6 % — AB (ref 11.0–14.6)
WBC: 5.7 10*3/uL (ref 4.0–10.3)
lymph#: 1.4 10*3/uL (ref 0.9–3.3)

## 2016-12-03 MED ORDER — DACARBAZINE 200 MG IV SOLR
375.0000 mg/m2 | Freq: Once | INTRAVENOUS | Status: AC
  Administered 2016-12-03: 940 mg via INTRAVENOUS
  Filled 2016-12-03: qty 47

## 2016-12-03 MED ORDER — PALONOSETRON HCL INJECTION 0.25 MG/5ML
0.2500 mg | Freq: Once | INTRAVENOUS | Status: AC
  Administered 2016-12-03: 0.25 mg via INTRAVENOUS

## 2016-12-03 MED ORDER — VINBLASTINE SULFATE CHEMO INJECTION 1 MG/ML
15.0000 mg | Freq: Once | INTRAVENOUS | Status: AC
  Administered 2016-12-03: 15 mg via INTRAVENOUS
  Filled 2016-12-03: qty 15

## 2016-12-03 MED ORDER — HEPARIN SOD (PORK) LOCK FLUSH 100 UNIT/ML IV SOLN
500.0000 [IU] | Freq: Once | INTRAVENOUS | Status: AC | PRN
  Administered 2016-12-03: 500 [IU]
  Filled 2016-12-03: qty 5

## 2016-12-03 MED ORDER — SODIUM CHLORIDE 0.9% FLUSH
10.0000 mL | INTRAVENOUS | Status: DC | PRN
  Administered 2016-12-03: 10 mL
  Filled 2016-12-03: qty 10

## 2016-12-03 MED ORDER — SODIUM CHLORIDE 0.9 % IV SOLN
Freq: Once | INTRAVENOUS | Status: AC
  Administered 2016-12-03: 12:00:00 via INTRAVENOUS

## 2016-12-03 MED ORDER — DOXORUBICIN HCL CHEMO IV INJECTION 2 MG/ML
25.0000 mg/m2 | Freq: Once | INTRAVENOUS | Status: AC
  Administered 2016-12-03: 62 mg via INTRAVENOUS
  Filled 2016-12-03: qty 31

## 2016-12-03 MED ORDER — PALONOSETRON HCL INJECTION 0.25 MG/5ML
INTRAVENOUS | Status: AC
  Filled 2016-12-03: qty 5

## 2016-12-03 MED ORDER — SODIUM CHLORIDE 0.9 % IV SOLN
Freq: Once | INTRAVENOUS | Status: AC
  Administered 2016-12-03: 12:00:00 via INTRAVENOUS
  Filled 2016-12-03: qty 5

## 2016-12-03 NOTE — Progress Notes (Signed)
12/03/2016  Patient given Adriamycin at 1238.  Blood return noted prior, during and after without any complications over 15 minutes

## 2016-12-03 NOTE — Patient Instructions (Signed)
[facility] Discharge Instructions for Patients Receiving Chemotherapy  Today you received the following chemotherapy agents :  Adriamycin, Velban, Dacarbazine.  To help prevent nausea and vomiting after your treatment, we encourage you to take your nausea medication as prescribed by your physician. Take Ondansetron on the 3rd day after chemo for nausea as needed.     If you develop nausea and vomiting that is not controlled by your nausea medication, call the clinic.   BELOW ARE SYMPTOMS THAT SHOULD BE REPORTED IMMEDIATELY:  *FEVER GREATER THAN 100.5 F  *CHILLS WITH OR WITHOUT FEVER  NAUSEA AND VOMITING THAT IS NOT CONTROLLED WITH YOUR NAUSEA MEDICATION  *UNUSUAL SHORTNESS OF BREATH  *UNUSUAL BRUISING OR BLEEDING  TENDERNESS IN MOUTH AND THROAT WITH OR WITHOUT PRESENCE OF ULCERS  *URINARY PROBLEMS  *BOWEL PROBLEMS  UNUSUAL RASH Items with * indicate a potential emergency and should be followed up as soon as possible.  Feel free to call the clinic you have any questions or concerns. The clinic phone number is [phone].  Please show the [place] at check-in to the Emergency Department and triage nurse.

## 2016-12-03 NOTE — Telephone Encounter (Signed)
Per 2/2 LOS ans staff message I have scheduled appts and gave calendar. Notified the scheduler

## 2016-12-03 NOTE — Telephone Encounter (Signed)
Appointments scheduled per 2/2 LOS. Patient given AVS report and calendars with future scheduled appointments. °

## 2016-12-04 LAB — SEDIMENTATION RATE: SED RATE: 15 mm/h (ref 0–15)

## 2016-12-04 NOTE — Progress Notes (Signed)
Craig Schmitt    HEMATOLOGY/ONCOLOGY CLINIC NOTE  Date of Service: .12/03/2016  Patient Care Team: No Pcp Per Patient as PCP - General (General Practice)  CHIEF COMPLAINTS/PURPOSE OF CONSULTATION:  Newly diagnosed classical [name]   HISTORY OF PRESENTING ILLNESS:  Plz see previous note for details.  INTERVAL HISTORY  Mr Slankard is here for followup prior to C4D15 of ABVD. He notes no acute new symptoms.  No fevers no chills, night sweats. No shortness of breath. Weight has been stable. We discussed the PET/CT results as shows excellent metabolic response to treatment. We discussed the rationale of discontinuing the Bleomycin and the patient understands this. Requested physical clearance for applying to join a college fraternity. Accompanied by his brother for this clinic visit. No other overt toxicities from chemotherapy.    MEDICAL HISTORY:  1)Hives to pollen  SURGICAL HISTORY: Past Surgical History:  Procedure Laterality Date  . IR GENERIC HISTORICAL  08/02/2016   IR FLUORO GUIDE PORT INSERTION RIGHT 08/02/2016 [name], MD [facility]  . IR GENERIC HISTORICAL  08/02/2016   IR US GUIDE VASC ACCESS RIGHT 08/02/2016 [name], MD [facility]  . KNEE ARTHROSCOPY    . LYMPH GLAND EXCISION Left 07/20/2016   Procedure: LEFT CERVICAL LYMPH NODE BIOPSY;  Surgeon: Jackolyn Confer, MD;  Location: [place];  Service: General;  Laterality: Left;    SOCIAL HISTORY: Social History   Social History  . Marital status: Single    Spouse name: N/A  . Number of children: N/A  . Years of education: N/A   Occupational History  . Not on file.   Social History Main Topics  . Smoking status: Never Smoker  . Smokeless tobacco: Never Used  . Alcohol use No  . Drug use: No  . Sexual activity: Not on file   Other Topics Concern  . Not on file   Social History Narrative  . No narrative on file  Never smoker   no significant alcohol use  FAMILY  HISTORY: family history of hypertension  No known family history of blood disorders or cancer .  ALLERGIES:  has No Known Allergies.  Allergic to pollen no other known drug or food allergies .  MEDICATIONS:  Current Outpatient Prescriptions  Medication Sig Dispense Refill  . dexamethasone (DECADRON) 4 MG tablet Take 2 tablets by mouth once a day on the day after chemotherapy and then take 2 tablets two times a day for 2 days. Take with food. 30 tablet 1  . ibuprofen (ADVIL,MOTRIN) 200 MG tablet Take 200 mg by mouth every 6 (six) hours as needed for mild pain.    Craig Schmitt lidocaine-prilocaine (EMLA) cream Apply to affected area once 30 g 3  . ondansetron (ZOFRAN) 8 MG tablet Take 1 tablet (8 mg total) by mouth 2 (two) times daily as needed. Start on the third day after chemotherapy. 30 tablet 1  . prochlorperazine (COMPAZINE) 10 MG tablet Take 1 tablet (10 mg total) by mouth every 6 (six) hours as needed (Nausea or vomiting). 30 tablet 1   No current facility-administered medications for this visit.     REVIEW OF SYSTEMS:    10 Point review of Systems was done is negative except as noted above.  PHYSICAL EXAMINATION: ECOG PERFORMANCE STATUS: 0 - Asymptomatic  . Vitals:   12/03/16 1023  BP: (!) 142/92  Pulse: 61  Resp: 18  Temp: 98.6 F (37 C)   Filed Weights   12/03/16 1023  Weight: 294 lb 14.4  oz (133.8 kg)   .Body mass index is 38.91 kg/m. . Wt Readings from Last 3 Encounters:  12/03/16 294 lb 14.4 oz (133.8 kg)  11/05/16 292 lb 8 oz (132.7 kg)  10/07/16 284 lb 1.6 oz (128.9 kg)   GENERAL:alert, well-built African-American gentleman in no acute distress and comfortable SKIN: skin color, texture, turgor are normal, no rashes or significant lesions EYES: normal, conjunctiva are pink and non-injected, sclera clear OROPHARYNX:no exudate, no erythema and lips, buccal mucosa, and tongue normal  NECK: supple, no JVD, thyroid normal size, non-tender, without nodularity LYMPH:   Palpable left cervical , left supraclavicular and left axillary lymph nodes .no palpable inguinal lymph nodes.  LUNGS: clear to auscultation with normal respiratory effort HEART: regular rate & rhythm,  no murmurs and no lower extremity edema ABDOMEN: abdomen soft, non-tender, normoactive bowel sounds , no palpable hepatosplenomegaly . Musculoskeletal: no cyanosis of digits and no clubbing  PSYCH: alert & oriented x 3 with fluent speech NEURO: no focal motor/sensory deficits  LABORATORY DATA:  I have reviewed the data as listed  CBC Latest Ref Rng & Units 12/03/2016 11/19/2016 11/05/2016  WBC 4.0 - 10.3 10e3/uL 5.7 5.8 5.1  Hemoglobin 13.0 - 17.1 g/dL 13.4 13.4 13.6  Hematocrit 38.4 - 49.9 % 40.1 40.7 41.4  Platelets 140 - 400 10e3/uL 192 195 161   . CBC    Component Value Date/Time   WBC 5.7 12/03/2016 0949   WBC 12.7 (H) 08/02/2016 0800   RBC 4.71 12/03/2016 0949   RBC 4.93 08/02/2016 0800   HGB 13.4 12/03/2016 0949   HCT 40.1 12/03/2016 0949   PLT 192 12/03/2016 0949   MCV 85.2 12/03/2016 0949   MCH 28.4 12/03/2016 0949   MCH 27.2 08/02/2016 0800   MCHC 33.4 12/03/2016 0949   MCHC 33.9 08/02/2016 0800   RDW 15.6 (H) 12/03/2016 0949   LYMPHSABS 1.4 12/03/2016 0949   MONOABS 1.1 (H) 12/03/2016 0949   EOSABS 0.2 12/03/2016 0949   BASOSABS 0.0 12/03/2016 0949    . CMP Latest Ref Rng & Units 12/03/2016 11/19/2016 11/05/2016  Glucose 70 - 140 mg/dl 78 81 89  BUN 7.0 - 26.0 mg/dL 16.6 23.4 17.5  Creatinine 0.7 - 1.3 mg/dL 1.2 1.3 1.2  Sodium 136 - 145 mEq/L 142 142 141  Potassium 3.5 - 5.1 mEq/L 4.1 4.1 4.4  Chloride 101 - 111 mmol/L - - -  CO2 22 - 29 mEq/L 25 25 29   Calcium 8.4 - 10.4 mg/dL 9.4 9.5 9.6  Total Protein 6.4 - 8.3 g/dL 6.7 7.1 7.0  Total Bilirubin 0.20 - 1.20 mg/dL 0.32 0.35 0.37  Alkaline Phos 40 - 150 U/L 61 57 62  AST 5 - 34 U/L 21 24 23   ALT 0 - 55 U/L 29 34 35     RADIOGRAPHIC STUDIES: I have personally reviewed the radiological images as listed and  agreed with the findings in the report.  .Nm Pet Image Restag (ps) Skull Base To Thigh  Result Date: 11/11/2016 CLINICAL DATA:  Subsequent treatment strategy for nodular sclerosis [name]. EXAM: NUCLEAR MEDICINE PET SKULL BASE TO THIGH TECHNIQUE: 14.5 mCi F-18 FDG was injected intravenously. Full-ring PET imaging was performed from the skull base to thigh after the radiotracer. CT data was obtained and used for attenuation correction and anatomic localization. FASTING BLOOD GLUCOSE:  Value: 88 mg/dl COMPARISON:  10/07/2009 2017 FINDINGS: NECK No hypermetabolic lymph nodes in the neck. Previous adenopathy has resolved. Incidental polypoid mucoperiosteal thickening in the left  maxillary sinus. CHEST Previous adenopathy has essentially resolved. Linear streaky opacities in the left axilla measuring up to 10 mm in thickness, previously 22 mm, with metabolic activity up to maximum SUV of 2.6, previously 27.0. Prior right upper lobe subpleural nodule measuring 12 mm currently has no hypermetabolic activity and currently measures 7 mm and this sub solid in appearance. ABDOMEN/PELVIS No abnormal hypermetabolic activity within the liver, pancreas, adrenal glands, or spleen. No hypermetabolic lymph nodes in the abdomen or pelvis. SKELETON Previous hypermetabolic bony activity including the right posterior iliac lesion previously shown has resolved. The right posterior iliac bone lesion was previously lucent with maximum SUV 15.0, now the lesion is much less conspicuous on CT and not hypermetabolic. IMPRESSION: 1. Previous hypermetabolic adenopathy in the neck and chest has resolved, previous abnormal osseous activity in the right posterior iliac bone has resolved. The previously hypermetabolic 12 mm right upper lobe pulmonary nodule now measures 7 mm and is sub solid and not hypermetabolic. Overall appearance compatible with excellent response to therapy. 2. Chronic left maxillary sinusitis. Electronically  Signed   By: Van Clines M.D.   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 Stage IVA as per PET/CT scan suggestive of bone marrow involvement. Sedimentation rate 43 (on diagnosis) - Now normalized with treatment ECHO nl EF PFTs WNL Port in Situ.  #2 Elevated transaminases after C1D1 treatment - now normalized  and has remained normal on subsequent checks.   #3 grade 1 fatigue - now resolved  #4 grade 1 nausea. Controlled with medications  Plan -discussed PET/CT and PFt findings in details. -Labs today stable. No prohibitive toxicities from chemotherapy at this time. -We shall proceed with cycle 4 day 15 of AVD. (dropping bleomycin from Cycle 4 given CR and some trend toward decrease in DLCO) -recommended complete avoidance of alcohol and other potential hepatotoxic medications. -Chemotherapy as per schedule  every 2 weeks . -Continue Zofran when necessary for nausea -Labs every 2 weeks prior to chemotherapy -RTC with Dr Irene Limbo 6 weeks on day of cycle 6 day 1 of chemotherapy. -will need PET/CT 3-4 weeks after completion of planned 6 cycles of chemotherapy. -will plan to have the port removed if post treatment PET/CT shows CR status.  I spent 20 minutes counseling the patient face to face. The total time spent in the appointment was 20 minutes and more than 50% was on counseling and direct patient cares.    Sullivan Lone MD [place] AAHIVMS [facility] Hematology/Oncology Physician [facility]  (Office):       [phone] (Work cell):  [phone] (Fax):           [phone]

## 2016-12-17 ENCOUNTER — Other Ambulatory Visit (HOSPITAL_BASED_OUTPATIENT_CLINIC_OR_DEPARTMENT_OTHER): Payer: Commercial Managed Care - HMO

## 2016-12-17 ENCOUNTER — Ambulatory Visit (HOSPITAL_BASED_OUTPATIENT_CLINIC_OR_DEPARTMENT_OTHER): Payer: Commercial Managed Care - HMO

## 2016-12-17 VITALS — BP 133/66 | HR 54 | Temp 97.9°F | Resp 18

## 2016-12-17 DIAGNOSIS — C8118 Nodular sclerosis classical Hodgkin lymphoma, lymph nodes of multiple sites: Secondary | ICD-10-CM | POA: Diagnosis not present

## 2016-12-17 DIAGNOSIS — Z5111 Encounter for antineoplastic chemotherapy: Secondary | ICD-10-CM | POA: Diagnosis not present

## 2016-12-17 LAB — COMPREHENSIVE METABOLIC PANEL
ALBUMIN: 3.6 g/dL (ref 3.5–5.0)
ALK PHOS: 53 U/L (ref 40–150)
ALT: 28 U/L (ref 0–55)
AST: 19 U/L (ref 5–34)
Anion Gap: 8 mEq/L (ref 3–11)
BUN: 17.9 mg/dL (ref 7.0–26.0)
CALCIUM: 9.2 mg/dL (ref 8.4–10.4)
CO2: 24 mEq/L (ref 22–29)
CREATININE: 1.2 mg/dL (ref 0.7–1.3)
Chloride: 108 mEq/L (ref 98–109)
EGFR: >90 mL/min/{1.73_m2} (ref >90)
Glucose: 104 mg/dl (ref 70–140)
Potassium: 4.1 mEq/L (ref 3.5–5.1)
Sodium: 140 mEq/L (ref 136–145)
TOTAL PROTEIN: 6.5 g/dL (ref 6.4–8.3)
Total Bilirubin: 0.43 mg/dL (ref 0.20–1.20)

## 2016-12-17 LAB — CBC WITH DIFFERENTIAL/PLATELET
BASO%: 0.6 % (ref 0.0–2.0)
Basophils Absolute: 0 10*3/uL (ref 0.0–0.1)
EOS%: 3.6 % (ref 0.0–7.0)
Eosinophils Absolute: 0.2 10*3/uL (ref 0.0–0.5)
HCT: 38.3 % — ABNORMAL LOW (ref 38.4–49.9)
HGB: 12.6 g/dL — ABNORMAL LOW (ref 13.0–17.1)
LYMPH%: 27.6 % (ref 14.0–49.0)
MCH: 28.1 pg (ref 27.2–33.4)
MCHC: 32.9 g/dL (ref 32.0–36.0)
MCV: 85.3 fL (ref 79.3–98.0)
MONO#: 0.8 10*3/uL (ref 0.1–0.9)
MONO%: 16.1 % — ABNORMAL HIGH (ref 0.0–14.0)
NEUT%: 52.1 % (ref 39.0–75.0)
NEUTROS ABS: 2.7 10*3/uL (ref 1.5–6.5)
Platelets: 165 10*3/uL (ref 140–400)
RBC: 4.49 10*6/uL (ref 4.20–5.82)
RDW: 15.1 % — ABNORMAL HIGH (ref 11.0–14.6)
WBC: 5.2 10*3/uL (ref 4.0–10.3)
lymph#: 1.4 10*3/uL (ref 0.9–3.3)

## 2016-12-17 MED ORDER — SODIUM CHLORIDE 0.9 % IV SOLN
375.0000 mg/m2 | Freq: Once | INTRAVENOUS | Status: AC
  Administered 2016-12-17: 940 mg via INTRAVENOUS
  Filled 2016-12-17: qty 47

## 2016-12-17 MED ORDER — PALONOSETRON HCL INJECTION 0.25 MG/5ML
INTRAVENOUS | Status: AC
  Filled 2016-12-17: qty 5

## 2016-12-17 MED ORDER — SODIUM CHLORIDE 0.9 % IV SOLN
Freq: Once | INTRAVENOUS | Status: AC
  Administered 2016-12-17: 10:00:00 via INTRAVENOUS

## 2016-12-17 MED ORDER — HEPARIN SOD (PORK) LOCK FLUSH 100 UNIT/ML IV SOLN
500.0000 [IU] | Freq: Once | INTRAVENOUS | Status: AC | PRN
  Administered 2016-12-17: 500 [IU]
  Filled 2016-12-17: qty 5

## 2016-12-17 MED ORDER — DOXORUBICIN HCL CHEMO IV INJECTION 2 MG/ML
25.0000 mg/m2 | Freq: Once | INTRAVENOUS | Status: AC
  Administered 2016-12-17: 62 mg via INTRAVENOUS
  Filled 2016-12-17: qty 31

## 2016-12-17 MED ORDER — VINBLASTINE SULFATE CHEMO INJECTION 1 MG/ML
6.0500 mg/m2 | Freq: Once | INTRAVENOUS | Status: AC
  Administered 2016-12-17: 15 mg via INTRAVENOUS
  Filled 2016-12-17: qty 15

## 2016-12-17 MED ORDER — SODIUM CHLORIDE 0.9% FLUSH
10.0000 mL | INTRAVENOUS | Status: DC | PRN
  Administered 2016-12-17: 10 mL
  Filled 2016-12-17: qty 10

## 2016-12-17 MED ORDER — SODIUM CHLORIDE 0.9 % IV SOLN
Freq: Once | INTRAVENOUS | Status: AC
  Administered 2016-12-17: 10:00:00 via INTRAVENOUS
  Filled 2016-12-17: qty 5

## 2016-12-17 MED ORDER — PALONOSETRON HCL INJECTION 0.25 MG/5ML
0.2500 mg | Freq: Once | INTRAVENOUS | Status: AC
  Administered 2016-12-17: 0.25 mg via INTRAVENOUS

## 2016-12-17 NOTE — Patient Instructions (Signed)
[facility] Discharge Instructions for Patients Receiving Chemotherapy  Today you received the following chemotherapy agents: Adriamycin, Vinblastine and Dacarbazine   To help prevent nausea and vomiting after your treatment, we encourage you to take your nausea medication as directed.    If you develop nausea and vomiting that is not controlled by your nausea medication, call the clinic.   BELOW ARE SYMPTOMS THAT SHOULD BE REPORTED IMMEDIATELY:  *FEVER GREATER THAN 100.5 F  *CHILLS WITH OR WITHOUT FEVER  NAUSEA AND VOMITING THAT IS NOT CONTROLLED WITH YOUR NAUSEA MEDICATION  *UNUSUAL SHORTNESS OF BREATH  *UNUSUAL BRUISING OR BLEEDING  TENDERNESS IN MOUTH AND THROAT WITH OR WITHOUT PRESENCE OF ULCERS  *URINARY PROBLEMS  *BOWEL PROBLEMS  UNUSUAL RASH Items with * indicate a potential emergency and should be followed up as soon as possible.  Feel free to call the clinic you have any questions or concerns. The clinic phone number is [phone].  Please show the CHEMO ALERT CARD at check-in to the Emergency Department and triage nurse.   

## 2016-12-17 NOTE — Progress Notes (Signed)
Blood return noted before, every 51mls during and after adriamycin push. Blood return also noted before and after vinblastine infusion.

## 2016-12-31 ENCOUNTER — Other Ambulatory Visit (HOSPITAL_BASED_OUTPATIENT_CLINIC_OR_DEPARTMENT_OTHER): Payer: Commercial Managed Care - HMO

## 2016-12-31 ENCOUNTER — Ambulatory Visit (HOSPITAL_BASED_OUTPATIENT_CLINIC_OR_DEPARTMENT_OTHER): Payer: Commercial Managed Care - HMO

## 2016-12-31 VITALS — BP 151/84 | HR 59 | Temp 98.0°F | Resp 18

## 2016-12-31 DIAGNOSIS — C8118 Nodular sclerosis classical Hodgkin lymphoma, lymph nodes of multiple sites: Secondary | ICD-10-CM

## 2016-12-31 DIAGNOSIS — Z5111 Encounter for antineoplastic chemotherapy: Secondary | ICD-10-CM | POA: Diagnosis not present

## 2016-12-31 LAB — COMPREHENSIVE METABOLIC PANEL
ALT: 27 U/L (ref 0–55)
AST: 19 U/L (ref 5–34)
Albumin: 3.5 g/dL (ref 3.5–5.0)
Alkaline Phosphatase: 68 U/L (ref 40–150)
Anion Gap: 8 mEq/L (ref 3–11)
BUN: 12.8 mg/dL (ref 7.0–26.0)
CALCIUM: 9.2 mg/dL (ref 8.4–10.4)
CHLORIDE: 111 meq/L — AB (ref 98–109)
CO2: 26 mEq/L (ref 22–29)
CREATININE: 1.2 mg/dL (ref 0.7–1.3)
EGFR: >90 mL/min/{1.73_m2} (ref >90)
GLUCOSE: 76 mg/dL (ref 70–140)
Potassium: 3.9 mEq/L (ref 3.5–5.1)
SODIUM: 145 meq/L (ref 136–145)
Total Bilirubin: 0.28 mg/dL (ref 0.20–1.20)
Total Protein: 6.3 g/dL — ABNORMAL LOW (ref 6.4–8.3)

## 2016-12-31 LAB — CBC WITH DIFFERENTIAL/PLATELET
BASO%: 0.8 % (ref 0.0–2.0)
Basophils Absolute: 0 10*3/uL (ref 0.0–0.1)
EOS%: 6.4 % (ref 0.0–7.0)
Eosinophils Absolute: 0.3 10*3/uL (ref 0.0–0.5)
HEMATOCRIT: 37.6 % — AB (ref 38.4–49.9)
HEMOGLOBIN: 12.3 g/dL — AB (ref 13.0–17.1)
LYMPH%: 25 % (ref 14.0–49.0)
MCH: 28.2 pg (ref 27.2–33.4)
MCHC: 32.7 g/dL (ref 32.0–36.0)
MCV: 86.2 fL (ref 79.3–98.0)
MONO#: 1.1 10*3/uL — ABNORMAL HIGH (ref 0.1–0.9)
MONO%: 22.1 % — ABNORMAL HIGH (ref 0.0–14.0)
NEUT#: 2.4 10*3/uL (ref 1.5–6.5)
NEUT%: 45.7 % (ref 39.0–75.0)
Platelets: 155 10*3/uL (ref 140–400)
RBC: 4.36 10*6/uL (ref 4.20–5.82)
RDW: 15.2 % — AB (ref 11.0–14.6)
WBC: 5.2 10*3/uL (ref 4.0–10.3)
lymph#: 1.3 10*3/uL (ref 0.9–3.3)

## 2016-12-31 MED ORDER — DOXORUBICIN HCL CHEMO IV INJECTION 2 MG/ML
25.0000 mg/m2 | Freq: Once | INTRAVENOUS | Status: AC
  Administered 2016-12-31: 62 mg via INTRAVENOUS
  Filled 2016-12-31: qty 31

## 2016-12-31 MED ORDER — SODIUM CHLORIDE 0.9 % IV SOLN
375.0000 mg/m2 | Freq: Once | INTRAVENOUS | Status: AC
  Administered 2016-12-31: 940 mg via INTRAVENOUS
  Filled 2016-12-31: qty 47

## 2016-12-31 MED ORDER — VINBLASTINE SULFATE CHEMO INJECTION 1 MG/ML
6.0500 mg/m2 | Freq: Once | INTRAVENOUS | Status: AC
  Administered 2016-12-31: 15 mg via INTRAVENOUS
  Filled 2016-12-31: qty 15

## 2016-12-31 MED ORDER — HEPARIN SOD (PORK) LOCK FLUSH 100 UNIT/ML IV SOLN
500.0000 [IU] | Freq: Once | INTRAVENOUS | Status: AC | PRN
  Administered 2016-12-31: 500 [IU]
  Filled 2016-12-31: qty 5

## 2016-12-31 MED ORDER — SODIUM CHLORIDE 0.9 % IV SOLN
Freq: Once | INTRAVENOUS | Status: AC
  Administered 2016-12-31: 10:00:00 via INTRAVENOUS
  Filled 2016-12-31: qty 5

## 2016-12-31 MED ORDER — PALONOSETRON HCL INJECTION 0.25 MG/5ML
INTRAVENOUS | Status: AC
  Filled 2016-12-31: qty 5

## 2016-12-31 MED ORDER — PALONOSETRON HCL INJECTION 0.25 MG/5ML
0.2500 mg | Freq: Once | INTRAVENOUS | Status: AC
  Administered 2016-12-31: 0.25 mg via INTRAVENOUS

## 2016-12-31 MED ORDER — SODIUM CHLORIDE 0.9 % IV SOLN
Freq: Once | INTRAVENOUS | Status: AC
  Administered 2016-12-31: 10:00:00 via INTRAVENOUS

## 2016-12-31 MED ORDER — SODIUM CHLORIDE 0.9% FLUSH
10.0000 mL | INTRAVENOUS | Status: DC | PRN
  Administered 2016-12-31: 10 mL
  Filled 2016-12-31: qty 10

## 2016-12-31 NOTE — Patient Instructions (Signed)
[facility] Discharge Instructions for Patients Receiving Chemotherapy  Today you received the following chemotherapy agents: Adriamycin, Velban, Dacarbazine   To help prevent nausea and vomiting after your treatment, we encourage you to take your nausea medication as directed.    If you develop nausea and vomiting that is not controlled by your nausea medication, call the clinic.   BELOW ARE SYMPTOMS THAT SHOULD BE REPORTED IMMEDIATELY:  *FEVER GREATER THAN 100.5 F  *CHILLS WITH OR WITHOUT FEVER  NAUSEA AND VOMITING THAT IS NOT CONTROLLED WITH YOUR NAUSEA MEDICATION  *UNUSUAL SHORTNESS OF BREATH  *UNUSUAL BRUISING OR BLEEDING  TENDERNESS IN MOUTH AND THROAT WITH OR WITHOUT PRESENCE OF ULCERS  *URINARY PROBLEMS  *BOWEL PROBLEMS  UNUSUAL RASH Items with * indicate a potential emergency and should be followed up as soon as possible.  Feel free to call the clinic you have any questions or concerns. The clinic phone number is [phone].  Please show the CHEMO ALERT CARD at check-in to the Emergency Department and triage nurse.   

## 2016-12-31 NOTE — Progress Notes (Signed)
Blood return noted before, every 3cc and after Adriamycin push. Blood return noted before and after Velban infusion.

## 2016-12-31 NOTE — Progress Notes (Signed)
Pt stable at discharge.  

## 2017-01-01 LAB — SEDIMENTATION RATE: Sedimentation Rate-Westergren: 5 mm/hr (ref 0–15)

## 2017-01-12 ENCOUNTER — Telehealth: Payer: Self-pay | Admitting: *Deleted

## 2017-01-12 ENCOUNTER — Ambulatory Visit: Payer: Commercial Managed Care - HMO | Admitting: Hematology

## 2017-01-12 ENCOUNTER — Other Ambulatory Visit: Payer: Commercial Managed Care - HMO

## 2017-01-12 ENCOUNTER — Telehealth: Payer: Self-pay | Admitting: Hematology

## 2017-01-12 NOTE — Telephone Encounter (Signed)
Pt missed lab/MD apt this morning.  Per Dr. Irene Limbo, ok to not see pt prior to infusion this cycle if pt feeling well.  This RN contacted pt, pt stated he is feeling well, no complaints or concerns at this time.  Will cancel MD visit this cycle and reschedule today's lab for 3/16 prior to infusion.  Pt instructed to arrive @845  on 3/16.  Scheduling message sent.

## 2017-01-12 NOTE — Telephone Encounter (Signed)
Patient called and said he overslept and missed his appointment and needs to reschedule

## 2017-01-14 ENCOUNTER — Ambulatory Visit (HOSPITAL_BASED_OUTPATIENT_CLINIC_OR_DEPARTMENT_OTHER): Payer: Commercial Managed Care - HMO

## 2017-01-14 ENCOUNTER — Telehealth: Payer: Self-pay | Admitting: Hematology

## 2017-01-14 ENCOUNTER — Ambulatory Visit: Payer: Commercial Managed Care - HMO | Admitting: Hematology

## 2017-01-14 ENCOUNTER — Other Ambulatory Visit: Payer: Commercial Managed Care - HMO

## 2017-01-14 VITALS — BP 150/79 | HR 71 | Temp 98.0°F | Resp 20

## 2017-01-14 DIAGNOSIS — Z5189 Encounter for other specified aftercare: Secondary | ICD-10-CM | POA: Diagnosis not present

## 2017-01-14 DIAGNOSIS — C8118 Nodular sclerosis classical Hodgkin lymphoma, lymph nodes of multiple sites: Secondary | ICD-10-CM

## 2017-01-14 DIAGNOSIS — Z5111 Encounter for antineoplastic chemotherapy: Secondary | ICD-10-CM

## 2017-01-14 LAB — COMPREHENSIVE METABOLIC PANEL
ALT: 27 U/L (ref 0–55)
ANION GAP: 10 meq/L (ref 3–11)
AST: 19 U/L (ref 5–34)
Albumin: 3.7 g/dL (ref 3.5–5.0)
Alkaline Phosphatase: 73 U/L (ref 40–150)
BUN: 14.2 mg/dL (ref 7.0–26.0)
CHLORIDE: 111 meq/L — AB (ref 98–109)
CO2: 24 meq/L (ref 22–29)
CREATININE: 1.4 mg/dL — AB (ref 0.7–1.3)
Calcium: 9.3 mg/dL (ref 8.4–10.4)
EGFR: 80 mL/min/{1.73_m2} — AB (ref >90)
Glucose: 96 mg/dl (ref 70–140)
Potassium: 3.9 mEq/L (ref 3.5–5.1)
Sodium: 144 mEq/L (ref 136–145)
Total Bilirubin: 0.26 mg/dL (ref 0.20–1.20)
Total Protein: 6.7 g/dL (ref 6.4–8.3)

## 2017-01-14 LAB — CBC WITH DIFFERENTIAL/PLATELET
BASO%: 1.1 % (ref 0.0–2.0)
BASOS ABS: 0.1 10*3/uL (ref 0.0–0.1)
EOS ABS: 0.2 10*3/uL (ref 0.0–0.5)
EOS%: 4 % (ref 0.0–7.0)
HEMATOCRIT: 36.5 % — AB (ref 38.4–49.9)
HGB: 12.1 g/dL — ABNORMAL LOW (ref 13.0–17.1)
LYMPH#: 1.4 10*3/uL (ref 0.9–3.3)
LYMPH%: 28.3 % (ref 14.0–49.0)
MCH: 28.3 pg (ref 27.2–33.4)
MCHC: 33.2 g/dL (ref 32.0–36.0)
MCV: 85.5 fL (ref 79.3–98.0)
MONO#: 1 10*3/uL — AB (ref 0.1–0.9)
MONO%: 18.8 % — ABNORMAL HIGH (ref 0.0–14.0)
NEUT#: 2.4 10*3/uL (ref 1.5–6.5)
NEUT%: 47.8 % (ref 39.0–75.0)
PLATELETS: 206 10*3/uL (ref 140–400)
RBC: 4.27 10*6/uL (ref 4.20–5.82)
RDW: 15.8 % — ABNORMAL HIGH (ref 11.0–14.6)
WBC: 5.1 10*3/uL (ref 4.0–10.3)

## 2017-01-14 MED ORDER — VINBLASTINE SULFATE CHEMO INJECTION 1 MG/ML
6.0500 mg/m2 | Freq: Once | INTRAVENOUS | Status: AC
  Administered 2017-01-14: 15 mg via INTRAVENOUS
  Filled 2017-01-14: qty 15

## 2017-01-14 MED ORDER — DOXORUBICIN HCL CHEMO IV INJECTION 2 MG/ML
25.0000 mg/m2 | Freq: Once | INTRAVENOUS | Status: AC
  Administered 2017-01-14: 62 mg via INTRAVENOUS
  Filled 2017-01-14: qty 31

## 2017-01-14 MED ORDER — SODIUM CHLORIDE 0.9 % IV SOLN
375.0000 mg/m2 | Freq: Once | INTRAVENOUS | Status: AC
  Administered 2017-01-14: 940 mg via INTRAVENOUS
  Filled 2017-01-14: qty 47

## 2017-01-14 MED ORDER — PALONOSETRON HCL INJECTION 0.25 MG/5ML
0.2500 mg | Freq: Once | INTRAVENOUS | Status: AC
  Administered 2017-01-14: 0.25 mg via INTRAVENOUS

## 2017-01-14 MED ORDER — PALONOSETRON HCL INJECTION 0.25 MG/5ML
INTRAVENOUS | Status: AC
  Filled 2017-01-14: qty 5

## 2017-01-14 MED ORDER — HEPARIN SOD (PORK) LOCK FLUSH 100 UNIT/ML IV SOLN
500.0000 [IU] | Freq: Once | INTRAVENOUS | Status: AC | PRN
  Administered 2017-01-14: 500 [IU]
  Filled 2017-01-14: qty 5

## 2017-01-14 MED ORDER — SODIUM CHLORIDE 0.9 % IV SOLN
Freq: Once | INTRAVENOUS | Status: AC
  Administered 2017-01-14: 12:00:00 via INTRAVENOUS

## 2017-01-14 MED ORDER — ALTEPLASE 2 MG IJ SOLR
2.0000 mg | Freq: Once | INTRAMUSCULAR | Status: AC | PRN
  Administered 2017-01-14: 2 mg
  Filled 2017-01-14: qty 2

## 2017-01-14 MED ORDER — FOSAPREPITANT DIMEGLUMINE INJECTION 150 MG
Freq: Once | INTRAVENOUS | Status: AC
  Administered 2017-01-14: 12:00:00 via INTRAVENOUS
  Filled 2017-01-14: qty 5

## 2017-01-14 MED ORDER — SODIUM CHLORIDE 0.9% FLUSH
10.0000 mL | INTRAVENOUS | Status: DC | PRN
  Administered 2017-01-14: 10 mL
  Filled 2017-01-14: qty 10

## 2017-01-14 NOTE — Progress Notes (Signed)
Cath-flo 72ml given at 1052. Blood return noted at 1150, 54ml removed.

## 2017-01-14 NOTE — Telephone Encounter (Signed)
Made lab appt per sch. msg  From [employer]. Pt already aware.

## 2017-01-14 NOTE — Telephone Encounter (Signed)
Patient is aware of new appts scheduled per 01/12/2017 sch msg from [employer]. Patient will pick up new schedule when he comes in today for treatment.

## 2017-01-14 NOTE — Patient Instructions (Signed)
[facility] Discharge Instructions for Patients Receiving Chemotherapy  Today you received the following chemotherapy agents Adriamycin, Vinblastine and DTIC  To help prevent nausea and vomiting after your treatment, we encourage you to take your nausea medication as directed. No Zofran for 3 days. Take Compazine instead.    If you develop nausea and vomiting that is not controlled by your nausea medication, call the clinic.   BELOW ARE SYMPTOMS THAT SHOULD BE REPORTED IMMEDIATELY:  *FEVER GREATER THAN 100.5 F  *CHILLS WITH OR WITHOUT FEVER  NAUSEA AND VOMITING THAT IS NOT CONTROLLED WITH YOUR NAUSEA MEDICATION  *UNUSUAL SHORTNESS OF BREATH  *UNUSUAL BRUISING OR BLEEDING  TENDERNESS IN MOUTH AND THROAT WITH OR WITHOUT PRESENCE OF ULCERS  *URINARY PROBLEMS  *BOWEL PROBLEMS  UNUSUAL RASH Items with * indicate a potential emergency and should be followed up as soon as possible.  Feel free to call the clinic you have any questions or concerns. The clinic phone number is [phone].  Please show the CHEMO ALERT CARD at check-in to the Emergency Department and triage nurse.   

## 2017-01-23 ENCOUNTER — Other Ambulatory Visit: Payer: Self-pay | Admitting: Hematology

## 2017-01-23 DIAGNOSIS — C8118 Nodular sclerosis classical Hodgkin lymphoma, lymph nodes of multiple sites: Secondary | ICD-10-CM

## 2017-01-28 ENCOUNTER — Other Ambulatory Visit (HOSPITAL_BASED_OUTPATIENT_CLINIC_OR_DEPARTMENT_OTHER): Payer: Commercial Managed Care - HMO

## 2017-01-28 ENCOUNTER — Ambulatory Visit (HOSPITAL_BASED_OUTPATIENT_CLINIC_OR_DEPARTMENT_OTHER): Payer: Commercial Managed Care - HMO

## 2017-01-28 VITALS — BP 132/75 | HR 73 | Temp 97.8°F | Resp 18

## 2017-01-28 DIAGNOSIS — Z5111 Encounter for antineoplastic chemotherapy: Secondary | ICD-10-CM | POA: Diagnosis not present

## 2017-01-28 DIAGNOSIS — C8118 Nodular sclerosis classical Hodgkin lymphoma, lymph nodes of multiple sites: Secondary | ICD-10-CM | POA: Diagnosis not present

## 2017-01-28 LAB — CBC WITH DIFFERENTIAL/PLATELET
BASO%: 0.9 % (ref 0.0–2.0)
Basophils Absolute: 0 10*3/uL (ref 0.0–0.1)
EOS ABS: 0.2 10*3/uL (ref 0.0–0.5)
EOS%: 3.3 % (ref 0.0–7.0)
HCT: 39 % (ref 38.4–49.9)
HGB: 12.9 g/dL — ABNORMAL LOW (ref 13.0–17.1)
LYMPH%: 20.6 % (ref 14.0–49.0)
MCH: 28.2 pg (ref 27.2–33.4)
MCHC: 32.9 g/dL (ref 32.0–36.0)
MCV: 85.5 fL (ref 79.3–98.0)
MONO#: 0.8 10*3/uL (ref 0.1–0.9)
MONO%: 15.5 % — AB (ref 0.0–14.0)
NEUT%: 59.7 % (ref 39.0–75.0)
NEUTROS ABS: 3.2 10*3/uL (ref 1.5–6.5)
Platelets: 164 10*3/uL (ref 140–400)
RBC: 4.56 10*6/uL (ref 4.20–5.82)
RDW: 16 % — ABNORMAL HIGH (ref 11.0–14.6)
WBC: 5.4 10*3/uL (ref 4.0–10.3)
lymph#: 1.1 10*3/uL (ref 0.9–3.3)

## 2017-01-28 LAB — COMPREHENSIVE METABOLIC PANEL
ALT: 24 U/L (ref 0–55)
AST: 18 U/L (ref 5–34)
Albumin: 3.6 g/dL (ref 3.5–5.0)
Alkaline Phosphatase: 61 U/L (ref 40–150)
Anion Gap: 8 mEq/L (ref 3–11)
BUN: 19.2 mg/dL (ref 7.0–26.0)
CHLORIDE: 109 meq/L (ref 98–109)
CO2: 24 meq/L (ref 22–29)
Calcium: 9.2 mg/dL (ref 8.4–10.4)
Creatinine: 1.2 mg/dL (ref 0.7–1.3)
GLUCOSE: 91 mg/dL (ref 70–140)
POTASSIUM: 3.8 meq/L (ref 3.5–5.1)
SODIUM: 141 meq/L (ref 136–145)
Total Bilirubin: 0.46 mg/dL (ref 0.20–1.20)
Total Protein: 6.6 g/dL (ref 6.4–8.3)

## 2017-01-28 LAB — SEDIMENTATION RATE: Sedimentation Rate-Westergren: 13 mm/hr (ref 0–15)

## 2017-01-28 MED ORDER — VINBLASTINE SULFATE CHEMO INJECTION 1 MG/ML
6.0500 mg/m2 | Freq: Once | INTRAVENOUS | Status: AC
  Administered 2017-01-28: 15 mg via INTRAVENOUS
  Filled 2017-01-28: qty 15

## 2017-01-28 MED ORDER — PALONOSETRON HCL INJECTION 0.25 MG/5ML
0.2500 mg | Freq: Once | INTRAVENOUS | Status: AC
  Administered 2017-01-28: 0.25 mg via INTRAVENOUS

## 2017-01-28 MED ORDER — DOXORUBICIN HCL CHEMO IV INJECTION 2 MG/ML
25.0000 mg/m2 | Freq: Once | INTRAVENOUS | Status: AC
  Administered 2017-01-28: 62 mg via INTRAVENOUS
  Filled 2017-01-28: qty 31

## 2017-01-28 MED ORDER — SODIUM CHLORIDE 0.9 % IV SOLN
Freq: Once | INTRAVENOUS | Status: AC
  Administered 2017-01-28: 12:00:00 via INTRAVENOUS
  Filled 2017-01-28: qty 5

## 2017-01-28 MED ORDER — SODIUM CHLORIDE 0.9 % IV SOLN
375.0000 mg/m2 | Freq: Once | INTRAVENOUS | Status: AC
  Administered 2017-01-28: 940 mg via INTRAVENOUS
  Filled 2017-01-28: qty 47

## 2017-01-28 MED ORDER — PALONOSETRON HCL INJECTION 0.25 MG/5ML
INTRAVENOUS | Status: AC
  Filled 2017-01-28: qty 5

## 2017-01-28 MED ORDER — HEPARIN SOD (PORK) LOCK FLUSH 100 UNIT/ML IV SOLN
500.0000 [IU] | Freq: Once | INTRAVENOUS | Status: AC | PRN
  Administered 2017-01-28: 500 [IU]
  Filled 2017-01-28: qty 5

## 2017-01-28 MED ORDER — SODIUM CHLORIDE 0.9 % IV SOLN
Freq: Once | INTRAVENOUS | Status: AC
  Administered 2017-01-28: 12:00:00 via INTRAVENOUS

## 2017-01-28 MED ORDER — SODIUM CHLORIDE 0.9% FLUSH
10.0000 mL | INTRAVENOUS | Status: DC | PRN
  Administered 2017-01-28: 10 mL
  Filled 2017-01-28: qty 10

## 2017-01-28 NOTE — Patient Instructions (Signed)
[facility] Discharge Instructions for Patients Receiving Chemotherapy  Today you received the following chemotherapy agents: Adriamycin, Velban, Dacarbazine   To help prevent nausea and vomiting after your treatment, we encourage you to take your nausea medication as directed.    If you develop nausea and vomiting that is not controlled by your nausea medication, call the clinic.   BELOW ARE SYMPTOMS THAT SHOULD BE REPORTED IMMEDIATELY:  *FEVER GREATER THAN 100.5 F  *CHILLS WITH OR WITHOUT FEVER  NAUSEA AND VOMITING THAT IS NOT CONTROLLED WITH YOUR NAUSEA MEDICATION  *UNUSUAL SHORTNESS OF BREATH  *UNUSUAL BRUISING OR BLEEDING  TENDERNESS IN MOUTH AND THROAT WITH OR WITHOUT PRESENCE OF ULCERS  *URINARY PROBLEMS  *BOWEL PROBLEMS  UNUSUAL RASH Items with * indicate a potential emergency and should be followed up as soon as possible.  Feel free to call the clinic you have any questions or concerns. The clinic phone number is [phone].  Please show the [place] at check-in to the Emergency Department and triage nurse.

## 2017-02-16 ENCOUNTER — Encounter (HOSPITAL_COMMUNITY)
Admission: RE | Admit: 2017-02-16 | Discharge: 2017-02-16 | Disposition: A | Discharge status: Home / Self Care | Payer: Commercial Managed Care - HMO | Source: Ambulatory Visit | Attending: Hematology | Admitting: Hematology

## 2017-02-16 DIAGNOSIS — C8118 Nodular sclerosis classical Hodgkin lymphoma, lymph nodes of multiple sites: Secondary | ICD-10-CM | POA: Diagnosis not present

## 2017-02-16 LAB — GLUCOSE, CAPILLARY: Glucose-Capillary: 89 mg/dL (ref 65–99)

## 2017-02-16 MED ORDER — FLUDEOXYGLUCOSE F - 18 (FDG) INJECTION
14.9000 | Freq: Once | INTRAVENOUS | Status: AC | PRN
  Administered 2017-02-16: 14.9 via INTRAVENOUS

## 2017-02-17 ENCOUNTER — Other Ambulatory Visit (HOSPITAL_BASED_OUTPATIENT_CLINIC_OR_DEPARTMENT_OTHER): Payer: Commercial Managed Care - HMO

## 2017-02-17 ENCOUNTER — Telehealth: Payer: Self-pay | Admitting: Hematology

## 2017-02-17 ENCOUNTER — Ambulatory Visit (HOSPITAL_BASED_OUTPATIENT_CLINIC_OR_DEPARTMENT_OTHER): Payer: Commercial Managed Care - HMO | Admitting: Hematology

## 2017-02-17 ENCOUNTER — Encounter: Payer: Self-pay | Admitting: Hematology

## 2017-02-17 VITALS — BP 159/85 | HR 62 | Temp 97.8°F | Resp 18 | Wt 300.3 lb

## 2017-02-17 DIAGNOSIS — R740 Nonspecific elevation of levels of transaminase and lactic acid dehydrogenase [LDH]: Secondary | ICD-10-CM

## 2017-02-17 DIAGNOSIS — C8118 Nodular sclerosis classical Hodgkin lymphoma, lymph nodes of multiple sites: Secondary | ICD-10-CM

## 2017-02-17 DIAGNOSIS — Z95828 Presence of other vascular implants and grafts: Secondary | ICD-10-CM

## 2017-02-17 LAB — COMPREHENSIVE METABOLIC PANEL
ALT: 29 U/L (ref 0–55)
ANION GAP: 9 meq/L (ref 3–11)
AST: 24 U/L (ref 5–34)
Albumin: 3.8 g/dL (ref 3.5–5.0)
Alkaline Phosphatase: 70 U/L (ref 40–150)
BILIRUBIN TOTAL: 0.26 mg/dL (ref 0.20–1.20)
BUN: 15.3 mg/dL (ref 7.0–26.0)
CALCIUM: 9.2 mg/dL (ref 8.4–10.4)
CHLORIDE: 110 meq/L — AB (ref 98–109)
CO2: 24 mEq/L (ref 22–29)
CREATININE: 1.3 mg/dL (ref 0.7–1.3)
EGFR: 90 mL/min/{1.73_m2} — ABNORMAL LOW (ref >90)
Glucose: 99 mg/dl (ref 70–140)
Potassium: 4 mEq/L (ref 3.5–5.1)
Sodium: 142 mEq/L (ref 136–145)
Total Protein: 6.6 g/dL (ref 6.4–8.3)

## 2017-02-17 LAB — CBC WITH DIFFERENTIAL/PLATELET
BASO%: 1.2 % (ref 0.0–2.0)
Basophils Absolute: 0.1 10*3/uL (ref 0.0–0.1)
EOS%: 6.7 % (ref 0.0–7.0)
Eosinophils Absolute: 0.3 10*3/uL (ref 0.0–0.5)
HEMATOCRIT: 40.5 % (ref 38.4–49.9)
HEMOGLOBIN: 13.3 g/dL (ref 13.0–17.1)
LYMPH#: 1.1 10*3/uL (ref 0.9–3.3)
LYMPH%: 21 % (ref 14.0–49.0)
MCH: 28.1 pg (ref 27.2–33.4)
MCHC: 32.8 g/dL (ref 32.0–36.0)
MCV: 85.4 fL (ref 79.3–98.0)
MONO#: 0.9 10*3/uL (ref 0.1–0.9)
MONO%: 16.8 % — ABNORMAL HIGH (ref 0.0–14.0)
NEUT#: 2.8 10*3/uL (ref 1.5–6.5)
NEUT%: 54.3 % (ref 39.0–75.0)
PLATELETS: 244 10*3/uL (ref 140–400)
RBC: 4.74 10*6/uL (ref 4.20–5.82)
RDW: 16.8 % — AB (ref 11.0–14.6)
WBC: 5.2 10*3/uL (ref 4.0–10.3)

## 2017-02-17 NOTE — Patient Instructions (Signed)
Thank you for choosing [facility] to provide your oncology and hematology care.  To afford each patient quality time with our providers, please arrive 30 minutes before your scheduled appointment time.  If you arrive late for your appointment, you may be asked to reschedule.  We strive to give you quality time with our providers, and arriving late affects you and other patients whose appointments are after yours.  If you are a no show for multiple scheduled visits, you may be dismissed from the clinic at the providers discretion.   Again, thank you for choosing [facility], our hope is that these requests will decrease the amount of time that you wait before being seen by our physicians.  ______________________________________________________________________ Should you have questions after your visit to the [facility], please contact our office at [phone] between the hours of 8:30 and 4:30 p.m.    Voicemails left after 4:30p.m will not be returned until the following business day.   For prescription refill requests, please have your pharmacy contact us directly.  Please also try to allow 48 hours for prescription requests.   Please contact the scheduling department for questions regarding scheduling.  For scheduling of procedures such as PET scans, CT scans, MRI, Ultrasound, etc please contact central scheduling at [phone].   Resources For Cancer Patients and Caregivers:  American Cancer Society:  800-227-2345  Can help patients locate various types of support and financial assistance Cancer Care: 1-800-813-HOPE (4673) Provides financial assistance, online support groups, medication/co-pay assistance.   [place] DSS:  336-641-3447 Where to apply for food stamps, Medicaid, and utility assistance Medicare Rights Center: 800-333-4114 Helps people with Medicare understand their rights and benefits, navigate the Medicare system, and secure the  quality healthcare they deserve SCAT: 336-333-6589 [place] [employer]'s shared-ride transportation service for eligible riders who have a disability that prevents them from riding the fixed route bus.   For additional information on assistance programs please contact our social worker:   Grier Hock/Abigail Elmore:  [phone] 

## 2017-02-17 NOTE — Telephone Encounter (Signed)
Scheduled appts per 4/19 los. Patient is aware and reminder letter sent out.

## 2017-02-18 ENCOUNTER — Telehealth: Payer: Self-pay | Admitting: Hematology

## 2017-02-18 NOTE — Telephone Encounter (Signed)
Called IR for port removal - Tiffany said she will give the patient a call and schedule the appt with them.

## 2017-02-21 NOTE — Progress Notes (Signed)
[facility]    HEMATOLOGY/ONCOLOGY CLINIC NOTE  Date of Service: .02/17/2017  Patient Care Team: No Pcp Per Patient as PCP - General (General Practice)  CHIEF COMPLAINTS/PURPOSE OF CONSULTATION:  Newly diagnosed classical [name]   HISTORY OF PRESENTING ILLNESS:  Plz see previous note for details.  INTERVAL HISTORY  Craig Schmitt is here for followup after completion of his 6 cycles of ABVD discuss this PET scan results. His PET/CT scan shows complete resolution of his known [name]. He has no acute new symptoms. No fevers no chills no night sweats. No palpable adenopathy. No abdominal pain or distention. No shortness of breath or chest pain. No rashes. We discussed in details the PET/CT scan results along with imaging findings.  Patient is understandably glad with the results of treatment. 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 We discussed in details NCCN guidelines for active surveillance and all his questions were answered in details.   MEDICAL HISTORY:  1)Hives to pollen  SURGICAL HISTORY: Past Surgical History:  Procedure Laterality Date  . IR GENERIC HISTORICAL  08/02/2016   IR FLUORO GUIDE PORT INSERTION RIGHT 08/02/2016 [name], MD [facility]-INTERV RAD  . IR GENERIC HISTORICAL  08/02/2016   IR US GUIDE VASC ACCESS RIGHT 08/02/2016 [name], MD [facility]-INTER[facility]  . KNEE ARTHROSCOPY    . LYMPH GLAND EXCISION Left 07/20/2016   Procedure: LEFT CERVICAL LYMPH NODE BIOPSY;  Surgeon: Jackolyn Confer, MD;  Location: [facility];  Service: General;  Laterality: Left;    SOCIAL HISTORY: Social History   Social History  . Marital status: Single    Spouse name: N/A  . Number of children: N/A  . Years of education: N/A   Occupational History  . Not on file.   Social History Main Topics  . Smoking status: Never Smoker  . Smokeless tobacco: Never Used  . Alcohol use No  . Drug use: No  . Sexual activity: Not on file   Other Topics  Concern  . Not on file   Social History Narrative  . No narrative on file  Never smoker   no significant alcohol use  FAMILY HISTORY: family history of hypertension  No known family history of blood disorders or cancer .  ALLERGIES:  has No Known Allergies.  Allergic to pollen no other known drug or food allergies .  MEDICATIONS:  Current Outpatient Prescriptions  Medication Sig Dispense Refill  . dexamethasone (DECADRON) 4 MG tablet Take 2 tablets by mouth once a day on the day after chemotherapy and then take 2 tablets two times a day for 2 days. Take with food. 30 tablet 1  . ibuprofen (ADVIL,MOTRIN) 200 MG tablet Take 200 mg by mouth every 6 (six) hours as needed for mild pain.    [facility] lidocaine-prilocaine (EMLA) cream Apply to affected area once 30 g 3  . ondansetron (ZOFRAN) 8 MG tablet Take 1 tablet (8 mg total) by mouth 2 (two) times daily as needed. Start on the third day after chemotherapy. 30 tablet 1  . prochlorperazine (COMPAZINE) 10 MG tablet Take 1 tablet (10 mg total) by mouth every 6 (six) hours as needed (Nausea or vomiting). 30 tablet 1   No current facility-administered medications for this visit.     REVIEW OF SYSTEMS:    10 Point review of Systems was done is negative except as noted above.  PHYSICAL EXAMINATION: ECOG PERFORMANCE STATUS: 0 - Asymptomatic  . Vitals:   02/17/17 1135  BP: (!) 159/85  Pulse: 62  Resp: 18  Temp: 97.8 F (36.6 C)   Filed Weights   02/17/17 1135  Weight: (!) 300 lb 5 oz (136.2 kg)   .Body mass index is 39.62 kg/m. . Wt Readings from Last 3 Encounters:  02/17/17 (!) 300 lb 5 oz (136.2 kg)  12/03/16 294 lb 14.4 oz (133.8 kg)  11/05/16 292 lb 8 oz (132.7 kg)   GENERAL:alert, well-built African-American gentleman in no acute distress and comfortable SKIN: skin color, texture, turgor are normal, no rashes or significant lesions EYES: normal, conjunctiva are pink and non-injected, sclera clear OROPHARYNX:no exudate, no  erythema and lips, buccal mucosa, and tongue normal  NECK: supple, no JVD, thyroid normal size, non-tender, without nodularity LYMPH:  Palpable left cervical , left supraclavicular and left axillary lymph nodes .no palpable inguinal lymph nodes.  LUNGS: clear to auscultation with normal respiratory effort HEART: regular rate & rhythm,  no murmurs and no lower extremity edema ABDOMEN: abdomen soft, non-tender, normoactive bowel sounds , no palpable hepatosplenomegaly . Musculoskeletal: no cyanosis of digits and no clubbing  PSYCH: alert & oriented x 3 with fluent speech NEURO: no focal motor/sensory deficits  LABORATORY DATA:  I have reviewed the data as listed  CBC Latest Ref Rng & Units 02/17/2017 01/28/2017 01/14/2017  WBC 4.0 - 10.3 10e3/uL 5.2 5.4 5.1  Hemoglobin 13.0 - 17.1 g/dL 13.3 12.9(L) 12.1(L)  Hematocrit 38.4 - 49.9 % 40.5 39.0 36.5(L)  Platelets 140 - 400 10e3/uL 244 164 206   . CBC    Component Value Date/Time   WBC 5.2 02/17/2017 1055   WBC 12.7 (H) 08/02/2016 0800   RBC 4.74 02/17/2017 1055   RBC 4.93 08/02/2016 0800   HGB 13.3 02/17/2017 1055   HCT 40.5 02/17/2017 1055   PLT 244 02/17/2017 1055   MCV 85.4 02/17/2017 1055   MCH 28.1 02/17/2017 1055   MCH 27.2 08/02/2016 0800   MCHC 32.8 02/17/2017 1055   MCHC 33.9 08/02/2016 0800   RDW 16.8 (H) 02/17/2017 1055   LYMPHSABS 1.1 02/17/2017 1055   MONOABS 0.9 02/17/2017 1055   EOSABS 0.3 02/17/2017 1055   BASOSABS 0.1 02/17/2017 1055    . CMP Latest Ref Rng & Units 02/17/2017 01/28/2017 01/14/2017  Glucose 70 - 140 mg/dl 99 91 96  BUN 7.0 - 26.0 mg/dL 15.3 19.2 14.2  Creatinine 0.7 - 1.3 mg/dL 1.3 1.2 1.4(H)  Sodium 136 - 145 mEq/L 142 141 144  Potassium 3.5 - 5.1 mEq/L 4.0 3.8 3.9  Chloride 101 - 111 mmol/L - - -  CO2 22 - 29 mEq/L 24 24 24   Calcium 8.4 - 10.4 mg/dL 9.2 9.2 9.3  Total Protein 6.4 - 8.3 g/dL 6.6 6.6 6.7  Total Bilirubin 0.20 - 1.20 mg/dL 0.26 0.46 0.26  Alkaline Phos 40 - 150 U/L 70 61 73    AST 5 - 34 U/L 24 18 19   ALT 0 - 55 U/L 29 24 27      RADIOGRAPHIC STUDIES: I have personally reviewed the radiological images as listed and agreed with the findings in the report.  .Nm Pet Image Restag (ps) Skull Base To Thigh  Result Date: 02/16/2017 CLINICAL DATA:  Subsequent treatment strategy for [name]. EXAM: NUCLEAR MEDICINE PET SKULL BASE TO THIGH TECHNIQUE: 14.9 mCi F-18 FDG was injected intravenously. Full-ring PET imaging was performed from the skull base to thigh after the radiotracer. CT data was obtained and used for attenuation correction and anatomic localization. FASTING BLOOD GLUCOSE:  Value: 89 mg/dl COMPARISON:  Multiple exams, including 11/11/2016 FINDINGS: NECK Fairly symmetric tonsillar activity, maximum SUV of the left palatine tonsil 10.0 and of the right palatine tonsil 12.6. This may well be physiologic in this setting. There is no appreciable hypermetabolic or pathologically enlarged adenopathy in the neck. This represents Deauville 1 disease. Mild chronic bilateral maxillary sinusitis. CHEST No current hypermetabolic or enlarged adenopathy in the chest. The prior extensive left axillary, supraclavicular, mediastinal, and right hilar adenopathy shown on 08/12/2016 has resolved (Deauville 1). The streaky opacities in the left axilla representing treated disease have significantly reduced compared to the 11/11/2016 exam. The previous right upper lobe nodule which was hypermetabolic on 18/84/1660 and subsequently sub solid and not hypermetabolic on 63/11/6008 is even less conspicuous as a sub solid lesion and measures 0.6 by 0.9 cm on image 19/8. No associated hypermetabolic activity. Right Port-A-Cath tip:  SVC. ABDOMEN/PELVIS No abnormal hypermetabolic activity within the liver, pancreas, adrenal glands, or spleen. No hypermetabolic lymph nodes in the abdomen or pelvis. SKELETON A previous small lucent right iliac bone lesion is not well seen today and is not  hypermetabolic. There is no current hypermetabolic bony activity observed in the included region. IMPRESSION: 1. There has been complete resolution of the prior enlarged and hypermetabolic adenopathy and the prior osseous activity shown on the 08/12/2016 exam. Compared to 11/11/16 there has been further reduction in conspicuity of the stranding in the left axilla which previously represented treated disease, and further reduction in conspicuity of the small right lucent iliac bone lesion. Overall this represents Deauville 1 disease. 2. The right upper lobe nodule that was previously hypermetabolic on 93/23/5573 and which was previously sub solid and not hypermetabolic on 22/12/5425 is currently even less conspicuous as a sub solid nodule and not hypermetabolic. Appearance compatible with treated lymphoma. 3. Fairly symmetric bilateral palatine tonsillar activity, favor physiologic activity. 4. Mild chronic bilateral maxillary sinusitis. Electronically Signed   By: Van Clines M.D.   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 Stage IVA as per PET/CT scan suggestive of bone marrow involvement. Sedimentation rate 43 (on diagnosis) - Now normalized with treatment ECHO nl EF PFTs WNL Port in Situ.  PET/CT post treatment completion on 02/16/2017 shows CR status with no residual disease > Deauville 1.  #2 Elevated transaminases after C1D1 treatment - now normalized  and has remained normal on subsequent checks.   #3 grade 1 fatigue - now resolved  Plan -discussed PET/CT results in details. Patient is understandably glad with treatment results. 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 He was recommended to avoid excessive  weight training or activities that would significantly increase afterload for the next few months. --Labs today stable. No prohibitive toxicities from chemotherapy at this time. -We discussed and he is agreeable to having his Port-A-Cath removed . Request has been placed for this . -Patient has no plans for having children in the near future and was recommended to wait for at least one year preferably 2 years though we discussed that there are very rigid guidelines regarding this . -She should get yearly flu shot and would also recommend Prevnar and PCV vaccination -A repeat echocardiogram /stress echo after 8-10 years  -  Carotid ultrasound in 8-10 years -Continue age-appropriate cancer screening with PCP -patient to establish primary care's. -We discussed the other NCCN active surveillance guidelines as noted below    RTC with Dr kale in 3 months with labs IR consultation for port-a-cath removal   I spent 30 minutes counseling the patient face to face. The total time spent in the appointment was 40 minutes and more than 50% was on counseling regarding survivorship and active surveillance guidelines and direct patient cares.    Sullivan Lone MD [place] AAHIVMS [facility] Hematology/Oncology Physician [facility]  (Office):       [phone] (Work cell):  [phone] (Fax):           [phone]

## 2017-02-22 ENCOUNTER — Encounter: Payer: Self-pay | Admitting: *Deleted

## 2017-03-01 ENCOUNTER — Other Ambulatory Visit: Payer: Self-pay | Admitting: Radiology

## 2017-03-02 ENCOUNTER — Ambulatory Visit (HOSPITAL_COMMUNITY)
Admission: RE | Admit: 2017-03-02 | Discharge: 2017-03-02 | Disposition: A | Discharge status: Home / Self Care | Payer: Commercial Managed Care - HMO | Source: Ambulatory Visit | Attending: Hematology | Admitting: Hematology

## 2017-03-02 ENCOUNTER — Encounter (HOSPITAL_COMMUNITY): Payer: Self-pay

## 2017-03-02 DIAGNOSIS — Z9221 Personal history of antineoplastic chemotherapy: Secondary | ICD-10-CM | POA: Diagnosis not present

## 2017-03-02 DIAGNOSIS — Z452 Encounter for adjustment and management of vascular access device: Secondary | ICD-10-CM | POA: Diagnosis not present

## 2017-03-02 DIAGNOSIS — Z95828 Presence of other vascular implants and grafts: Secondary | ICD-10-CM

## 2017-03-02 DIAGNOSIS — Z8571 Personal history of Hodgkin lymphoma: Secondary | ICD-10-CM | POA: Diagnosis not present

## 2017-03-02 DIAGNOSIS — C8118 Nodular sclerosis classical Hodgkin lymphoma, lymph nodes of multiple sites: Secondary | ICD-10-CM

## 2017-03-02 HISTORY — PX: IR REMOVAL TUN ACCESS W/ PORT W/O FL MOD SED: IMG2290

## 2017-03-02 LAB — CBC WITH DIFFERENTIAL/PLATELET
BASOS ABS: 0 10*3/uL (ref 0.0–0.1)
Basophils Relative: 0 %
Eosinophils Absolute: 0.8 10*3/uL — ABNORMAL HIGH (ref 0.0–0.7)
Eosinophils Relative: 10 %
HEMATOCRIT: 39.9 % (ref 39.0–52.0)
HEMOGLOBIN: 13 g/dL (ref 13.0–17.0)
Lymphocytes Relative: 22 %
Lymphs Abs: 1.6 10*3/uL (ref 0.7–4.0)
MCH: 27.8 pg (ref 26.0–34.0)
MCHC: 32.6 g/dL (ref 30.0–36.0)
MCV: 85.4 fL (ref 78.0–100.0)
Monocytes Absolute: 0.5 10*3/uL (ref 0.1–1.0)
Monocytes Relative: 7 %
NEUTROS ABS: 4.7 10*3/uL (ref 1.7–7.7)
NEUTROS PCT: 61 %
Platelets: 199 10*3/uL (ref 150–400)
RBC: 4.67 MIL/uL (ref 4.22–5.81)
RDW: 15.6 % — AB (ref 11.5–15.5)
WBC: 7.6 10*3/uL (ref 4.0–10.5)

## 2017-03-02 LAB — PROTIME-INR
INR: 1.1
PROTHROMBIN TIME: 14.2 s (ref 11.4–15.2)

## 2017-03-02 MED ORDER — MIDAZOLAM HCL 2 MG/2ML IJ SOLN
INTRAMUSCULAR | Status: AC | PRN
  Administered 2017-03-02 (×2): 1 mg via INTRAVENOUS

## 2017-03-02 MED ORDER — FENTANYL CITRATE (PF) 100 MCG/2ML IJ SOLN
INTRAMUSCULAR | Status: AC
Start: 2017-03-02 — End: 2017-03-03
  Filled 2017-03-02: qty 4

## 2017-03-02 MED ORDER — MIDAZOLAM HCL 2 MG/2ML IJ SOLN
INTRAMUSCULAR | Status: AC
  Filled 2017-03-02: qty 4

## 2017-03-02 MED ORDER — SODIUM CHLORIDE 0.9 % IV SOLN
INTRAVENOUS | Status: DC
  Administered 2017-03-02: 13:00:00 via INTRAVENOUS

## 2017-03-02 MED ORDER — LIDOCAINE-EPINEPHRINE 2 %-1:100000 IJ SOLN
INTRAMUSCULAR | Status: AC | PRN
  Administered 2017-03-02: 10 mL via INTRADERMAL

## 2017-03-02 MED ORDER — LIDOCAINE-EPINEPHRINE (PF) 2 %-1:200000 IJ SOLN
INTRAMUSCULAR | Status: AC
  Filled 2017-03-02: qty 20

## 2017-03-02 MED ORDER — FENTANYL CITRATE (PF) 100 MCG/2ML IJ SOLN
INTRAMUSCULAR | Status: AC | PRN
  Administered 2017-03-02 (×2): 50 ug via INTRAVENOUS

## 2017-03-02 MED ORDER — CEFAZOLIN SODIUM-DEXTROSE 2-4 GM/100ML-% IV SOLN
2.0000 g | INTRAVENOUS | Status: AC
  Administered 2017-03-02: 2 g via INTRAVENOUS

## 2017-03-02 MED ORDER — CEFAZOLIN SODIUM-DEXTROSE 2-4 GM/100ML-% IV SOLN
INTRAVENOUS | Status: AC
  Filled 2017-03-02: qty 100

## 2017-03-02 NOTE — Discharge Instructions (Signed)
Implanted Port Removal, Care After °Refer to this sheet in the next few weeks. These instructions provide you with information about caring for yourself after your procedure. Your health care provider may also give you more specific instructions. Your treatment has been planned according to current medical practices, but problems sometimes occur. Call your health care provider if you have any problems or questions after your procedure. °What can I expect after the procedure? °After the procedure, it is common to have: °· Soreness or pain near your incision. °· Some swelling or bruising near your incision. °Follow these instructions at home: °Medicines  °· Take over-the-counter and prescription medicines only as told by your health care provider. °· If you were prescribed an antibiotic medicine, take it as told by your health care provider. Do not stop taking the antibiotic even if you start to feel better. °Bathing  °· Do not take baths, swim, or use a hot tub until your health care provider approves. Ask your health care provider if you can take showers. You may only be allowed to take sponge baths for bathing. °Incision care  °· Follow instructions from your health care provider about how to take care of your incision. Make sure you: °¨ Wash your hands with soap and water before you change your bandage (dressing). If soap and water are not available, use hand sanitizer. °¨ Change your dressing as told by your health care provider. °¨ Keep your dressing dry. °¨ Leave stitches (sutures), skin glue, or adhesive strips in place. These skin closures may need to stay in place for 2 weeks or longer. If adhesive strip edges start to loosen and curl up, you may trim the loose edges. Do not remove adhesive strips completely unless your health care provider tells you to do that. °· Check your incision area every day for signs of infection. Check for: °¨ More redness, swelling, or pain. °¨ More fluid or  blood. °¨ Warmth. °¨ Pus or a bad smell. °Driving  °· If you received a sedative, do not drive for 24 hours after the procedure. °· If you did not receive a sedative, ask your health care provider when it is safe to drive. °Activity  °· Return to your normal activities as told by your health care provider. Ask your health care provider what activities are safe for you. °· Until your health care provider says it is safe: °¨ Do not lift anything that is heavier than 10 lb (4.5 kg). °¨ Do not do activities that involve lifting your arms over your head. °General instructions  °· Do not use any tobacco products, such as cigarettes, chewing tobacco, and e-cigarettes. Tobacco can delay healing. If you need help quitting, ask your health care provider. °· Keep all follow-up visits as told by your health care provider. This is important. °Contact a health care provider if: °· You have more redness, swelling, or pain around your incision. °· You have more fluid or blood coming from your incision. °· Your incision feels warm to the touch. °· You have pus or a bad smell coming from your incision. °· You have a fever. °· You have pain that is not relieved by your pain medicine. °Get help right away if: °· You have chest pain. °· You have difficulty breathing. °This information is not intended to replace advice given to you by your health care provider. Make sure you discuss any questions you have with your health care provider. °Document Released: 09/29/2015 Document Revised: 03/25/2016 Document   Reviewed: 07/23/2015 Elsevier Interactive Patient Education  2017 [place]. Moderate Conscious Sedation, Adult, Care After These instructions provide you with information about caring for yourself after your procedure. Your health care provider may also give you more specific instructions. Your treatment has been planned according to current medical practices, but problems sometimes occur. Call your health care provider if you  have any problems or questions after your procedure. What can I expect after the procedure? After your procedure, it is common:  To feel sleepy for several hours.  To feel clumsy and have poor balance for several hours.  To have poor judgment for several hours.  To vomit if you eat too soon. Follow these instructions at home: For at least 24 hours after the procedure:    Do not:  Participate in activities where you could fall or become injured.  Drive.  Use heavy machinery.  Drink alcohol.  Take sleeping pills or medicines that cause drowsiness.  Make important decisions or sign legal documents.  Take care of children on your own.  Rest. Eating and drinking   Follow the diet recommended by your health care provider.  If you vomit:  Drink water, juice, or soup when you can drink without vomiting.  Make sure you have little or no nausea before eating solid foods. General instructions   Have a responsible adult stay with you until you are awake and alert.  Take over-the-counter and prescription medicines only as told by your health care provider.  If you smoke, do not smoke without supervision.  Keep all follow-up visits as told by your health care provider. This is important. Contact a health care provider if:  You keep feeling nauseous or you keep vomiting.  You feel light-headed.  You develop a rash.  You have a fever. Get help right away if:  You have trouble breathing. This information is not intended to replace advice given to you by your health care provider. Make sure you discuss any questions you have with your health care provider. Document Released: 08/08/2013 Document Revised: 03/22/2016 Document Reviewed: 02/07/2016 Elsevier Interactive Patient Education  2017 [employer].

## 2017-03-02 NOTE — Procedures (Signed)
Successful removal of right anterior chest wall port-a-cath. EBL: Minimal No immediate post procedural complications.   Ronny Bacon, MD Pager #: [phone]

## 2017-03-02 NOTE — H&P (Signed)
Chief Complaint: [name]  Referring Physician: Dr. Sullivan Lone  Supervising Physician: Sandi Mariscal  Patient Status: [facility] - Out-pt  HPI: Craig Schmitt is a 31 y.o. male who was diagnosed with [name] last year.  He had a port a cath placed about 6 months ago.  He has completed all of his treatment and had a PET scan that shows no further disease.  He has been sent to IR today for removal of his port a cath.  He has no other complaints today.  Past Medical History:  Past Medical History:  Diagnosis Date  . Cancer ([place])    hodgkins lymphoma-Sept.2017    Past Surgical History:  Past Surgical History:  Procedure Laterality Date  . IR GENERIC HISTORICAL  08/02/2016   IR FLUORO GUIDE PORT INSERTION RIGHT 08/02/2016 [name], MD [facility]  . IR GENERIC HISTORICAL  08/02/2016   IR US GUIDE VASC ACCESS RIGHT 08/02/2016 [name], MD [facility]  . KNEE ARTHROSCOPY    . LYMPH GLAND EXCISION Left 07/20/2016   Procedure: LEFT CERVICAL LYMPH NODE BIOPSY;  Surgeon: Jackolyn Confer, MD;  Location: [place];  Service: General;  Laterality: Left;    Family History: History reviewed. No pertinent family history.  Social History:  reports that he has never smoked. He has never used smokeless tobacco. He reports that he does not drink alcohol or use drugs.  Allergies: No Known Allergies  Medications: Medications reviewed in epic  Please HPI for pertinent positives, otherwise complete 10 system ROS negative.  Mallampati Score: MD Evaluation Airway: WNL Heart: WNL Abdomen: WNL Chest/ Lungs: WNL ASA  Classification: 1 Mallampati/Airway Score: One  Physical Exam: BP (!) 134/58   Pulse (!) 53   Temp 98 F (36.7 C) (Oral)   Resp 16   Ht 6\' 1"  (1.854 m)   Wt 298 lb 2 oz (135.2 kg)   SpO2 99%   BMI 39.33 kg/m  Body mass index is 39.33 kg/m. General: pleasant, obese black male who is laying in bed in NAD HEENT: head is  normocephalic, atraumatic.  Sclera are noninjected.  PERRL.  Ears and nose without any masses or lesions.  Mouth is pink and moist Heart: regular, rate, and rhythm.  Normal s1,s2. No obvious murmurs, gallops, or rubs noted.  Palpable radial and pedal pulses bilaterally Lungs: CTAB, no wheezes, rhonchi, or rales noted.  Respiratory effort nonlabored.  Port a cath in place in right upper chest Psych: A&Ox3 with an appropriate affect.   Labs: Results for orders placed or performed during the hospital encounter of 03/02/17 (from the past 48 hour(s))  CBC with Differential/Platelet     Status: Abnormal   Collection Time: 03/02/17  1:20 PM  Result Value Ref Range   WBC 7.6 4.0 - 10.5 K/uL   RBC 4.67 4.22 - 5.81 MIL/uL   Hemoglobin 13.0 13.0 - 17.0 g/dL   HCT 39.9 39.0 - 52.0 %   MCV 85.4 78.0 - 100.0 fL   MCH 27.8 26.0 - 34.0 pg   MCHC 32.6 30.0 - 36.0 g/dL   RDW 15.6 (H) 11.5 - 15.5 %   Platelets 199 150 - 400 K/uL   Neutrophils Relative % 61 %   Neutro Abs 4.7 1.7 - 7.7 K/uL   Lymphocytes Relative 22 %   Lymphs Abs 1.6 0.7 - 4.0 K/uL   Monocytes Relative 7 %   Monocytes Absolute 0.5 0.1 - 1.0 K/uL   Eosinophils Relative 10 %  Eosinophils Absolute 0.8 (H) 0.0 - 0.7 K/uL   Basophils Relative 0 %   Basophils Absolute 0.0 0.0 - 0.1 K/uL    Imaging: No results found.  Assessment/Plan 1. [name] The patient has completed treatment for his lymphoma.  His labs and vitals have been reviewed.  The procedure of port removal has been explained to the patient.  The risks of bleeding and infection have also been discussed.  The patient understands all of this and is agreeable to proceed.  Thank you for this interesting consult.  I greatly enjoyed meeting TAYT MOYERS and look forward to participating in their care.  A copy of this report was sent to the requesting provider on this date.  Electronically Signed: Henreitta Cea 03/02/2017, 1:29 PM   I spent a total of    25  Minutes in face to face in clinical consultation, greater than 50% of which was counseling/coordinating care for [name]

## 2017-05-19 ENCOUNTER — Ambulatory Visit (HOSPITAL_BASED_OUTPATIENT_CLINIC_OR_DEPARTMENT_OTHER): Payer: Commercial Managed Care - HMO | Admitting: Hematology

## 2017-05-19 ENCOUNTER — Other Ambulatory Visit (HOSPITAL_BASED_OUTPATIENT_CLINIC_OR_DEPARTMENT_OTHER): Payer: Commercial Managed Care - HMO

## 2017-05-19 ENCOUNTER — Encounter: Payer: Self-pay | Admitting: Hematology

## 2017-05-19 VITALS — BP 130/80 | HR 40 | Temp 97.9°F | Resp 18 | Ht 73.0 in | Wt 284.5 lb

## 2017-05-19 DIAGNOSIS — C8118 Nodular sclerosis classical Hodgkin lymphoma, lymph nodes of multiple sites: Secondary | ICD-10-CM

## 2017-05-19 LAB — COMPREHENSIVE METABOLIC PANEL
ALBUMIN: 3.9 g/dL (ref 3.5–5.0)
ALK PHOS: 72 U/L (ref 40–150)
ALT: 12 U/L (ref 0–55)
ANION GAP: 9 meq/L (ref 3–11)
AST: 13 U/L (ref 5–34)
BILIRUBIN TOTAL: 0.35 mg/dL (ref 0.20–1.20)
BUN: 12.7 mg/dL (ref 7.0–26.0)
CALCIUM: 9.2 mg/dL (ref 8.4–10.4)
CO2: 24 mEq/L (ref 22–29)
Chloride: 108 mEq/L (ref 98–109)
Creatinine: 1.2 mg/dL (ref 0.7–1.3)
GLUCOSE: 86 mg/dL (ref 70–140)
Potassium: 4.1 mEq/L (ref 3.5–5.1)
Sodium: 141 mEq/L (ref 136–145)
TOTAL PROTEIN: 6.8 g/dL (ref 6.4–8.3)

## 2017-05-19 LAB — CBC & DIFF AND RETIC
BASO%: 0.3 % (ref 0.0–2.0)
BASOS ABS: 0 10*3/uL (ref 0.0–0.1)
EOS%: 6.7 % (ref 0.0–7.0)
Eosinophils Absolute: 0.2 10*3/uL (ref 0.0–0.5)
HEMATOCRIT: 43.9 % (ref 38.4–49.9)
HEMOGLOBIN: 15.9 g/dL (ref 13.0–17.1)
IMMATURE RETIC FRACT: 4.3 % (ref 3.00–10.60)
LYMPH%: 35.5 % (ref 14.0–49.0)
MCH: 30.6 pg (ref 27.2–33.4)
MCHC: 36.2 g/dL — ABNORMAL HIGH (ref 32.0–36.0)
MCV: 84.6 fL (ref 79.3–98.0)
MONO#: 0.3 10*3/uL (ref 0.1–0.9)
MONO%: 7.3 % (ref 0.0–14.0)
NEUT#: 1.7 10*3/uL (ref 1.5–6.5)
NEUT%: 50.2 % (ref 39.0–75.0)
PLATELETS: 146 10*3/uL (ref 140–400)
RBC: 5.19 10*6/uL (ref 4.20–5.82)
RDW: 14.6 % (ref 11.0–14.6)
Retic %: 0.81 % (ref 0.80–1.80)
Retic Ct Abs: 42.04 10*3/uL (ref 34.80–93.90)
WBC: 3.4 10*3/uL — ABNORMAL LOW (ref 4.0–10.3)
lymph#: 1.2 10*3/uL (ref 0.9–3.3)
nRBC: 0 % (ref 0–0)

## 2017-05-19 NOTE — Patient Instructions (Signed)
Thank you for choosing [facility] to provide your oncology and hematology care.  To afford each patient quality time with our providers, please arrive 30 minutes before your scheduled appointment time.  If you arrive late for your appointment, you may be asked to reschedule.  We strive to give you quality time with our providers, and arriving late affects you and other patients whose appointments are after yours.   If you are a no show for multiple scheduled visits, you may be dismissed from the clinic at the providers discretion.    Again, thank you for choosing [facility], our hope is that these requests will decrease the amount of time that you wait before being seen by our physicians.  ______________________________________________________________________  Should you have questions after your visit to the [facility], please contact our office at [phone] between the hours of 8:30 and 4:30 p.m.    Voicemails left after 4:30p.m will not be returned until the following business day.    For prescription refill requests, please have your pharmacy contact us directly.  Please also try to allow 48 hours for prescription requests.    Please contact the scheduling department for questions regarding scheduling.  For scheduling of procedures such as PET scans, CT scans, MRI, Ultrasound, etc please contact central scheduling at [phone].    Resources For Cancer Patients and Caregivers:   Oncolink.org:  A wonderful resource for patients and healthcare providers for information regarding your disease, ways to tract your treatment, what to expect, etc.     American Cancer Society:  800-227-2345  Can help patients locate various types of support and financial assistance  Cancer Care: 1-800-813-HOPE (4673) Provides financial assistance, online support groups, medication/co-pay assistance.    [place] DSS:  336-641-3447 Where to apply for food  stamps, Medicaid, and utility assistance  Medicare Rights Center: 800-333-4114 Helps people with Medicare understand their rights and benefits, navigate the Medicare system, and secure the quality healthcare they deserve  SCAT: 336-333-6589 [place] [employer]'s shared-ride transportation service for eligible riders who have a disability that prevents them from riding the fixed route bus.    For additional information on assistance programs please contact our social worker:   Grier Hock/Abigail Elmore:  [phone]            

## 2017-05-20 LAB — SEDIMENTATION RATE: Sedimentation Rate-Westergren: 5 mm/hr (ref 0–15)

## 2017-05-22 NOTE — Progress Notes (Signed)
Craig Schmitt    HEMATOLOGY/ONCOLOGY CLINIC NOTE  Date of Service: .05/19/2017  Patient Care Team: Patient, No Pcp Per as PCP - General (General Practice)  CHIEF COMPLAINTS/PURPOSE OF CONSULTATION:  F/u for classical [name]   Diagnosis Classical [name] nodular sclerosis type with no type b constitutional symptoms. Stage IVA as per PET/CT scan suggestive of bone marrow involvement.  Treatment ABVD x 3 cycles -> AVD X 3 cycles Completed treatment 01/28/2017  HISTORY OF PRESENTING ILLNESS:  Plz see previous note for details.  INTERVAL HISTORY  Craig Schmitt is here for 3 month follow-up after completion of in March 2018. He notes no acute new concerns. 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 No fevers no chills no night sweats no new enlarged lymph nodes noted. No chest pain no shortness of breath. No abdominal pain. No fevers or chills. Has been working out and has lost the extra weight he had gained during treatment with steroids. Good energy levels.   MEDICAL HISTORY:  1)Hives to pollen  SURGICAL HISTORY: Past Surgical History:  Procedure Laterality Date  . IR GENERIC HISTORICAL  08/02/2016   IR FLUORO GUIDE PORT INSERTION RIGHT 08/02/2016 [name], MD [facility]  . IR GENERIC HISTORICAL  08/02/2016   IR US GUIDE VASC ACCESS RIGHT 08/02/2016 [name], MD [facility]  . IR REMOVAL TUN ACCESS W/ PORT W/O FL MOD SED  03/02/2017  . KNEE ARTHROSCOPY    . LYMPH GLAND EXCISION Left 07/20/2016   Procedure: LEFT CERVICAL LYMPH NODE BIOPSY;  Surgeon: Jackolyn Confer, MD;  Location: [place];  Service: General;  Laterality: Left;    SOCIAL HISTORY: Social History   Social History  . Marital status: Single    Spouse name: N/A  . Number of children: N/A  . Years of education: N/A   Occupational History  . Not on file.   Social History Main Topics  . Smoking status: Never Smoker  . Smokeless tobacco: Never Used  . Alcohol use  No  . Drug use: No  . Sexual activity: Not on file   Other Topics Concern  . Not on file   Social History Narrative  . No narrative on file  Never smoker   no significant alcohol use  FAMILY HISTORY: family history of hypertension  No known family history of blood disorders or cancer .  ALLERGIES:  has No Known Allergies.  Allergic to pollen no other known drug or food allergies .  MEDICATIONS:  Current Outpatient Prescriptions  Medication Sig Dispense Refill  . ibuprofen (ADVIL,MOTRIN) 200 MG tablet Take 200 mg by mouth every 6 (six) hours as needed for mild pain.     No current facility-administered medications for this visit.     REVIEW OF SYSTEMS:    10 Point review of Systems was done is negative except as noted above.  PHYSICAL EXAMINATION: ECOG PERFORMANCE STATUS: 0 - Asymptomatic  . Vitals:   05/19/17 1118  BP: 130/80  Pulse: (!) 40  Resp: 18  Temp: 97.9 F (36.6 C)   Filed Weights   05/19/17 1118  Weight: 284 lb 8 oz (129 kg)   .Body mass index is 37.54 kg/m. . Wt Readings from Last 3 Encounters:  05/19/17 284 lb 8 oz (129 kg)  03/02/17 298 lb 2 oz (135.2 kg)  02/17/17 (!) 300 lb 5 oz (136.2 kg)   GENERAL:alert, well-built African-American gentleman in no acute distress and comfortable SKIN: skin color, texture, turgor are normal,  no rashes or significant lesions EYES: normal, conjunctiva are pink and non-injected, sclera clear OROPHARYNX:no exudate, no erythema and lips, buccal mucosa, and tongue normal  NECK: supple, no JVD, thyroid normal size, non-tender, without nodularity LYMPH:  Palpable left cervical , left supraclavicular and left axillary lymph nodes .no palpable inguinal lymph nodes.  LUNGS: clear to auscultation with normal respiratory effort HEART: regular rate & rhythm,  no murmurs and no lower extremity edema ABDOMEN: abdomen soft, non-tender, normoactive bowel sounds , no palpable hepatosplenomegaly . Musculoskeletal: no  cyanosis of digits and no clubbing  PSYCH: alert & oriented x 3 with fluent speech NEURO: no focal motor/sensory deficits  LABORATORY DATA:  I have reviewed the data as listed  CBC Latest Ref Rng & Units 05/19/2017 03/02/2017 02/17/2017  WBC 4.0 - 10.3 10e3/uL 3.4(L) 7.6 5.2  Hemoglobin 13.0 - 17.1 g/dL 15.9 13.0 13.3  Hematocrit 38.4 - 49.9 % 43.9 39.9 40.5  Platelets 140 - 400 10e3/uL 146 199 244   . CBC    Component Value Date/Time   WBC 3.4 (L) 05/19/2017 1020   WBC 7.6 03/02/2017 1320   RBC 5.19 05/19/2017 1020   RBC 4.67 03/02/2017 1320   HGB 15.9 05/19/2017 1020   HCT 43.9 05/19/2017 1020   PLT 146 05/19/2017 1020   MCV 84.6 05/19/2017 1020   MCH 30.6 05/19/2017 1020   MCH 27.8 03/02/2017 1320   MCHC 36.2 (H) 05/19/2017 1020   MCHC 32.6 03/02/2017 1320   RDW 14.6 05/19/2017 1020   LYMPHSABS 1.2 05/19/2017 1020   MONOABS 0.3 05/19/2017 1020   EOSABS 0.2 05/19/2017 1020   BASOSABS 0.0 05/19/2017 1020    . CMP Latest Ref Rng & Units 05/19/2017 02/17/2017 01/28/2017  Glucose 70 - 140 mg/dl 86 99 91  BUN 7.0 - 26.0 mg/dL 12.7 15.3 19.2  Creatinine 0.7 - 1.3 mg/dL 1.2 1.3 1.2  Sodium 136 - 145 mEq/L 141 142 141  Potassium 3.5 - 5.1 mEq/L 4.1 4.0 3.8  Chloride 101 - 111 mmol/L - - -  CO2 22 - 29 mEq/L 24 24 24   Calcium 8.4 - 10.4 mg/dL 9.2 9.2 9.2  Total Protein 6.4 - 8.3 g/dL 6.8 6.6 6.6  Total Bilirubin 0.20 - 1.20 mg/dL 0.35 0.26 0.46  Alkaline Phos 40 - 150 U/L 72 70 61  AST 5 - 34 U/L 13 24 18   ALT 0 - 55 U/L 12 29 24       RADIOGRAPHIC STUDIES: I have personally reviewed the radiological images as listed and agreed with the findings in the report.  .No results found.     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 Stage IVA as per PET/CT scan suggestive of bone marrow involvement. Sedimentation rate 43 (on  diagnosis) - Now normalized with treatment ECHO nl EF PFTs WNL Port in Situ.  PET/CT post treatment completion on 02/16/2017 shows CR status with no residual disease > [place] 1.  #2 Elevated transaminases after C1D1 treatment - now normalized  and has remained normal on subsequent checks.   Plan --Labs today stable. No prohibitive toxicities from chemotherapy. -Patient has no clinical or lab evidence of [name] progression at this time. -Patient has no plans for having children in the near future and was recommended to wait for at least one year preferably 2 years though we discussed that there arent very rigid guidelines  regarding this . -he should get yearly flu shot and would also recommend Prevnar and PCV vaccination --Continue age-appropriate cancer screening with PCP -patient to establish primary care's. -We discussed the other NCCN active surveillance guidelines as noted below   RTC with Dr kale in 3 months with labs  I spent 20 minutes counseling the patient face to face. The total time spent in the appointment was 25 minutes and more than 50% was on counseling regarding survivorship and active surveillance guidelines and direct patient cares.    Sullivan Lone MD [place] AAHIVMS [facility] Hematology/Oncology Physician [facility]  (Office):       [phone] (Work cell):  [phone] (Fax):           [phone]

## 2017-08-18 ENCOUNTER — Telehealth: Payer: Self-pay | Admitting: Hematology

## 2017-08-18 ENCOUNTER — Ambulatory Visit (HOSPITAL_BASED_OUTPATIENT_CLINIC_OR_DEPARTMENT_OTHER): Payer: 59 | Admitting: Hematology

## 2017-08-18 ENCOUNTER — Encounter: Payer: Self-pay | Admitting: Hematology

## 2017-08-18 ENCOUNTER — Other Ambulatory Visit (HOSPITAL_BASED_OUTPATIENT_CLINIC_OR_DEPARTMENT_OTHER): Payer: 59

## 2017-08-18 VITALS — BP 133/69 | HR 51 | Temp 97.7°F | Resp 18 | Ht 73.0 in | Wt 270.5 lb

## 2017-08-18 DIAGNOSIS — C8118 Nodular sclerosis classical Hodgkin lymphoma, lymph nodes of multiple sites: Secondary | ICD-10-CM | POA: Diagnosis not present

## 2017-08-18 DIAGNOSIS — R740 Nonspecific elevation of levels of transaminase and lactic acid dehydrogenase [LDH]: Secondary | ICD-10-CM

## 2017-08-18 LAB — COMPREHENSIVE METABOLIC PANEL
ALBUMIN: 4 g/dL (ref 3.5–5.0)
ALK PHOS: 72 U/L (ref 40–150)
ALT: 17 U/L (ref 0–55)
ANION GAP: 9 meq/L (ref 3–11)
AST: 21 U/L (ref 5–34)
BILIRUBIN TOTAL: 0.61 mg/dL (ref 0.20–1.20)
BUN: 16.5 mg/dL (ref 7.0–26.0)
CALCIUM: 9.3 mg/dL (ref 8.4–10.4)
CHLORIDE: 107 meq/L (ref 98–109)
CO2: 26 mEq/L (ref 22–29)
CREATININE: 1.2 mg/dL (ref 0.7–1.3)
EGFR: >60 mL/min/{1.73_m2} (ref >60)
Glucose: 82 mg/dl (ref 70–140)
Potassium: 4 mEq/L (ref 3.5–5.1)
Sodium: 142 mEq/L (ref 136–145)
TOTAL PROTEIN: 7.3 g/dL (ref 6.4–8.3)

## 2017-08-18 LAB — CBC & DIFF AND RETIC
BASO%: 0.3 % (ref 0.0–2.0)
BASOS ABS: 0 10*3/uL (ref 0.0–0.1)
EOS ABS: 0.3 10*3/uL (ref 0.0–0.5)
EOS%: 4.5 % (ref 0.0–7.0)
HEMATOCRIT: 43.2 % (ref 38.4–49.9)
HEMOGLOBIN: 14.1 g/dL (ref 13.0–17.1)
IMMATURE RETIC FRACT: 9.4 % (ref 3.00–10.60)
LYMPH#: 2.3 10*3/uL (ref 0.9–3.3)
LYMPH%: 32 % (ref 14.0–49.0)
MCH: 28.5 pg (ref 27.2–33.4)
MCHC: 32.6 g/dL (ref 32.0–36.0)
MCV: 87.3 fL (ref 79.3–98.0)
MONO#: 0.6 10*3/uL (ref 0.1–0.9)
MONO%: 8.2 % (ref 0.0–14.0)
NEUT#: 3.9 10*3/uL (ref 1.5–6.5)
NEUT%: 55 % (ref 39.0–75.0)
PLATELETS: 199 10*3/uL (ref 140–400)
RBC: 4.95 10*6/uL (ref 4.20–5.82)
RDW: 14.6 % (ref 11.0–14.6)
RETIC %: 1.07 % (ref 0.80–1.80)
RETIC CT ABS: 52.97 10*3/uL (ref 34.80–93.90)
WBC: 7.1 10*3/uL (ref 4.0–10.3)

## 2017-08-18 NOTE — Patient Instructions (Signed)
Thank you for choosing [facility] to provide your oncology and hematology care.  To afford each patient quality time with our providers, please arrive 30 minutes before your scheduled appointment time.  If you arrive late for your appointment, you may be asked to reschedule.  We strive to give you quality time with our providers, and arriving late affects you and other patients whose appointments are after yours.   If you are a no show for multiple scheduled visits, you may be dismissed from the clinic at the providers discretion.    Again, thank you for choosing [facility], our hope is that these requests will decrease the amount of time that you wait before being seen by our physicians.  ______________________________________________________________________  Should you have questions after your visit to the [facility], please contact our office at [phone] between the hours of 8:30 and 4:30 p.m.    Voicemails left after 4:30p.m will not be returned until the following business day.    For prescription refill requests, please have your pharmacy contact us directly.  Please also try to allow 48 hours for prescription requests.    Please contact the scheduling department for questions regarding scheduling.  For scheduling of procedures such as PET scans, CT scans, MRI, Ultrasound, etc please contact central scheduling at [phone].    Resources For Cancer Patients and Caregivers:   Oncolink.org:  A wonderful resource for patients and healthcare providers for information regarding your disease, ways to tract your treatment, what to expect, etc.     American Cancer Society:  800-227-2345  Can help patients locate various types of support and financial assistance  Cancer Care: 1-800-813-HOPE (4673) Provides financial assistance, online support groups, medication/co-pay assistance.    [place] DSS:  336-641-3447 Where to apply for food  stamps, Medicaid, and utility assistance  Medicare Rights Center: 800-333-4114 Helps people with Medicare understand their rights and benefits, navigate the Medicare system, and secure the quality healthcare they deserve  SCAT: 336-333-6589 [place] [employer]'s shared-ride transportation service for eligible riders who have a disability that prevents them from riding the fixed route bus.    For additional information on assistance programs please contact our social worker:   Grier Hock/Abigail Elmore:  [phone]            

## 2017-08-18 NOTE — Telephone Encounter (Signed)
Scheduled appt per 10/18 los - left message and sent reminder letter in the mail.

## 2017-08-18 NOTE — Progress Notes (Signed)
Craig Schmitt    HEMATOLOGY/ONCOLOGY CLINIC NOTE  Date of Service: 08/18/17  Patient Care Team: Patient, No Pcp Per as PCP - General (General Practice)  CHIEF COMPLAINTS/PURPOSE OF CONSULTATION:  F/u for classical [name]   Diagnosis Classical [name] nodular sclerosis type with no type b constitutional symptoms. Stage IVA as per PET/CT scan suggestive of bone marrow involvement.  Treatment ABVD x 3 cycles -> AVD X 3 cycles Completed treatment 01/28/2017  HISTORY OF PRESENTING ILLNESS:  Plz see previous note for details.  INTERVAL HISTORY  Craig Schmitt is here for 3 month follow-up after completion of ABVD->AVD in March 2018. He notes no acute new concerns. He reports that sometimes he will experience occasional parosmia which he is unable to describe in full. He denies any nausea, headaches, or other associated symptoms. He has begun his workout regimen again and is staying active, he recently completed a 5k. His energy levels have been very good overall. Blood counts and chemistries are trending normally today. He continues to be in good spirits. He denies any fever, chills, new or enlarged palpable lymph nodes.   On review of systems, pt denies fever, chills, rash, mouth sores, weight loss, decreased appetite, urinary complaints. Denies pain. Pt denies abdominal pain, nausea, vomiting. Denies leg swelling or neuropathy.   MEDICAL HISTORY:  1)Hives to pollen  SURGICAL HISTORY: Past Surgical History:  Procedure Laterality Date  . IR GENERIC HISTORICAL  08/02/2016   IR FLUORO GUIDE PORT INSERTION RIGHT 08/02/2016 [name], MD [facility]  . IR GENERIC HISTORICAL  08/02/2016   IR US GUIDE VASC ACCESS RIGHT 08/02/2016 [name], MD [facility]  . IR REMOVAL TUN ACCESS W/ PORT W/O FL MOD SED  03/02/2017  . KNEE ARTHROSCOPY    . LYMPH GLAND EXCISION Left 07/20/2016   Procedure: LEFT CERVICAL LYMPH NODE BIOPSY;  Surgeon: Jackolyn Confer, MD;  Location: [facility];  Service: General;  Laterality: Left;    SOCIAL HISTORY: Social History   Social History  . Marital status: Single    Spouse name: N/A  . Number of children: N/A  . Years of education: N/A   Occupational History  . Not on file.   Social History Main Topics  . Smoking status: Never Smoker  . Smokeless tobacco: Never Used  . Alcohol use No  . Drug use: No  . Sexual activity: Not on file   Other Topics Concern  . Not on file   Social History Narrative  . No narrative on file  Never smoker   no significant alcohol use  FAMILY HISTORY: family history of hypertension  No known family history of blood disorders or cancer .  ALLERGIES:  has No Known Allergies.  Allergic to pollen no other known drug or food allergies .  MEDICATIONS:  Current Outpatient Prescriptions  Medication Sig Dispense Refill  . ibuprofen (ADVIL,MOTRIN) 200 MG tablet Take 200 mg by mouth every 6 (six) hours as needed for mild pain.     No current facility-administered medications for this visit.     REVIEW OF SYSTEMS:    A 10+ POINT REVIEW OF SYSTEMS WAS OBTAINED including neurology, dermatology, psychiatry, cardiac, respiratory, lymph, extremities, GI, GU, Musculoskeletal, constitutional, breasts, reproductive, HEENT.  All pertinent positives are noted in the HPI.  All others are negative.  PHYSICAL EXAMINATION: ECOG PERFORMANCE STATUS: 0 - Asymptomatic  Vitals:   08/18/17 0917  BP: 133/69  Pulse: (!) 51  Resp: 18  Temp: 97.7 F (36.5  C)  SpO2: 99%   Filed Weights   08/18/17 0917  Weight: 270 lb 8 oz (122.7 kg)   Body mass index is 35.69 kg/m. . Wt Readings from Last 3 Encounters:  08/18/17 270 lb 8 oz (122.7 kg)  05/19/17 284 lb 8 oz (129 kg)  03/02/17 298 lb 2 oz (135.2 kg)   GENERAL:alert, well-built African-American gentleman in no acute distress and comfortable SKIN: skin color, texture, turgor are normal, no rashes or significant lesions EYES: normal,  conjunctiva are pink and non-injected, sclera clear OROPHARYNX:no exudate, no erythema and lips, buccal mucosa, and tongue normal  NECK: supple, no JVD, thyroid normal size, non-tender, without nodularity LYMPH:  No palpable cervical, axillary, supraclavicular, or inguinal lymph nodes. LUNGS: clear to auscultation with normal respiratory effort HEART: regular rate & rhythm,  no murmurs and no lower extremity edema ABDOMEN: abdomen soft, non-tender, normoactive bowel sounds , no palpable hepatosplenomegaly. Musculoskeletal: no cyanosis of digits and no clubbing  PSYCH: alert & oriented x 3 with fluent speech NEURO: no focal motor/sensory deficits  LABORATORY DATA:  I have reviewed the data as listed  CBC Latest Ref Rng & Units 08/18/2017 05/19/2017 03/02/2017  WBC 4.0 - 10.3 10e3/uL 7.1 3.4(L) 7.6  Hemoglobin 13.0 - 17.1 g/dL 14.1 15.9 13.0  Hematocrit 38.4 - 49.9 % 43.2 43.9 39.9  Platelets 140 - 400 10e3/uL 199 146 199   . CBC    Component Value Date/Time   WBC 7.1 08/18/2017 0840   WBC 7.6 03/02/2017 1320   RBC 4.95 08/18/2017 0840   RBC 4.67 03/02/2017 1320   HGB 14.1 08/18/2017 0840   HCT 43.2 08/18/2017 0840   PLT 199 08/18/2017 0840   MCV 87.3 08/18/2017 0840   MCH 28.5 08/18/2017 0840   MCH 27.8 03/02/2017 1320   MCHC 32.6 08/18/2017 0840   MCHC 32.6 03/02/2017 1320   RDW 14.6 08/18/2017 0840   LYMPHSABS 2.3 08/18/2017 0840   MONOABS 0.6 08/18/2017 0840   EOSABS 0.3 08/18/2017 0840   BASOSABS 0.0 08/18/2017 0840    . CMP Latest Ref Rng & Units 08/18/2017 05/19/2017 02/17/2017  Glucose 70 - 140 mg/dl 82 86 99  BUN 7.0 - 26.0 mg/dL 16.5 12.7 15.3  Creatinine 0.7 - 1.3 mg/dL 1.2 1.2 1.3  Sodium 136 - 145 mEq/L 142 141 142  Potassium 3.5 - 5.1 mEq/L 4.0 4.1 4.0  Chloride 101 - 111 mmol/L - - -  CO2 22 - 29 mEq/L 26 24 24   Calcium 8.4 - 10.4 mg/dL 9.3 9.2 9.2  Total Protein 6.4 - 8.3 g/dL 7.3 6.8 6.6  Total Bilirubin 0.20 - 1.20 mg/dL 0.61 0.35 0.26  Alkaline Phos  40 - 150 U/L 72 72 70  AST 5 - 34 U/L 21 13 24   ALT 0 - 55 U/L 17 12 29        RADIOGRAPHIC STUDIES: I have personally reviewed the radiological images as listed and agreed with the findings in the report.  No results found.  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 Stage IVA as per PET/CT scan suggestive of bone marrow involvement. Sedimentation rate 43 (on diagnosis) - Now normalized with treatment  PET/CT post treatment completion on 02/16/2017 shows CR status with no residual disease > Deauville 1.  #2 Elevated transaminases after C1D1 treatment - now normalized  and has remained normal on subsequent checks.  Plan -Labs today stable. No residual toxicities from chemotherapy. -Patient has no clinical or lab evidence of [name] progression at this time. Nl Sed rate. -recent work related PFT testing showed nl DLCO. -Patient has no plans for having children in the near future and he previously was recommended to wait for at least one year preferably 2 years though we discussed that there arent very rigid guidelines regarding this. -he should get yearly flu shot and would also recommend Prevnar and PCV vaccination. -Continue age-appropriate cancer screening with PCP -patient to establish primary care.  RTC with Dr kale in 3 months with labs  I spent 20 minutes counseling the patient face to face. The total time spent in the appointment was 25 minutes and more than 50% was on counseling regarding survivorship and active surveillance guidelines and direct patient cares.   Sullivan Lone MD [place] AAHIVMS [facility] Hematology/Oncology Physician [place]  (Office):       [phone] (Work cell):  [phone] (Fax):           [phone]  This document serves as a record of services personally performed by Sullivan Lone, MD. It was created on his behalf by Reola Mosher, a trained medical scribe. The creation of this record is based on the scribe's personal observations and the provider's statements to them. This document has been checked and approved by the attending provider.

## 2017-08-19 LAB — SEDIMENTATION RATE: SED RATE: 10 mm/h (ref 0–15)

## 2017-11-16 NOTE — Progress Notes (Signed)
Craig Kitchen    HEMATOLOGY/ONCOLOGY CLINIC NOTE  Date of Service: 11/18/17  Patient Care Team: Patient, No Pcp Per as PCP - General (General Practice)  CHIEF COMPLAINTS/PURPOSE OF CONSULTATION:  F/u for classical [name]   Diagnosis Classical [name] nodular sclerosis type with no type b constitutional symptoms. Stage IVA as per PET/CT scan suggestive of bone marrow involvement.  Treatment ABVD x 3 cycles -> AVD X 3 cycles Completed treatment 01/28/2017  HISTORY OF PRESENTING ILLNESS:  Plz see previous note for details.  INTERVAL HISTORY  Craig Schmitt is here for 9 month follow-up after completion of ABVD->AVD in March 2018. He reports that he is doing well overall and has been travelling to [place] for some time off.. CBC labs today are WNL. Pt also got a flu shot this year.   On review of systems, pt reports left neck bump has subsided at this time. Pt denies fatigue, tingling or weakness, abdomen pain, leg swelling, and any other symptoms.   No fevers/chills/night sweats/weight loss at this time.   MEDICAL HISTORY:  1)Hives to pollen  SURGICAL HISTORY: Past Surgical History:  Procedure Laterality Date  . IR GENERIC HISTORICAL  08/02/2016   IR FLUORO GUIDE PORT INSERTION RIGHT 08/02/2016 [name], MD [facility]  . IR GENERIC HISTORICAL  08/02/2016   IR US GUIDE VASC ACCESS RIGHT 08/02/2016 [name], MD [facility]  . IR REMOVAL TUN ACCESS W/ PORT W/O FL MOD SED  03/02/2017  . KNEE ARTHROSCOPY    . LYMPH GLAND EXCISION Left 07/20/2016   Procedure: LEFT CERVICAL LYMPH NODE BIOPSY;  Surgeon: Jackolyn Confer, MD;  Location: [facility];  Service: General;  Laterality: Left;    SOCIAL HISTORY: Social History   Socioeconomic History  . Marital status: Single    Spouse name: Not on file  . Number of children: Not on file  . Years of education: Not on file  . Highest education level: Not on file  Social Needs  . Financial resource  strain: Not on file  . Food insecurity - worry: Not on file  . Food insecurity - inability: Not on file  . Transportation needs - medical: Not on file  . Transportation needs - non-medical: Not on file  Occupational History  . Not on file  Tobacco Use  . Smoking status: Never Smoker  . Smokeless tobacco: Never Used  Substance and Sexual Activity  . Alcohol use: No  . Drug use: No  . Sexual activity: Not on file  Other Topics Concern  . Not on file  Social History Narrative  . Not on file  Never smoker   no significant alcohol use  FAMILY HISTORY: family history of hypertension  No known family history of blood disorders or cancer .  ALLERGIES:  has No Known Allergies.  Allergic to pollen no other known drug or food allergies .  MEDICATIONS:  Current Outpatient Medications  Medication Sig Dispense Refill  . ibuprofen (ADVIL,MOTRIN) 200 MG tablet Take 200 mg by mouth every 6 (six) hours as needed for mild pain.     No current facility-administered medications for this visit.     REVIEW OF SYSTEMS:    A 10+ POINT REVIEW OF SYSTEMS WAS OBTAINED including neurology, dermatology, psychiatry, cardiac, respiratory, lymph, extremities, GI, GU, Musculoskeletal, constitutional, breasts, reproductive, HEENT.  All pertinent positives are noted in the HPI.  All others are negative.  PHYSICAL EXAMINATION:  ECOG PERFORMANCE STATUS: 0 - Asymptomatic  Vitals:   11/18/17  0924  BP: 123/65  Pulse: (!) 106  Resp: 18  Temp: 98.4 F (36.9 C)  SpO2: 99%   Filed Weights   11/18/17 0924  Weight: 292 lb 1.6 oz (132.5 kg)   Body mass index is 38.54 kg/m. . Wt Readings from Last 3 Encounters:  11/18/17 292 lb 1.6 oz (132.5 kg)  08/18/17 270 lb 8 oz (122.7 kg)  05/19/17 284 lb 8 oz (129 kg)   GENERAL:alert, well-built African-American gentleman in no acute distress and comfortable SKIN: skin color, texture, turgor are normal, no rashes or significant lesions EYES: normal,  conjunctiva are pink and non-injected, sclera clear OROPHARYNX:no exudate, no erythema and lips, buccal mucosa, and tongue normal  NECK: supple, no JVD, thyroid normal size, non-tender, without nodularity LYMPH:  No palpable cervical, axillary, supraclavicular, or inguinal lymph nodes. LUNGS: clear to auscultation with normal respiratory effort HEART: regular rate & rhythm,  no murmurs and no lower extremity edema ABDOMEN: abdomen soft, non-tender, normoactive bowel sounds , no palpable hepatosplenomegaly. Musculoskeletal: no cyanosis of digits and no clubbing  PSYCH: alert & oriented x 3 with fluent speech NEURO: no focal motor/sensory deficits  LABORATORY DATA:  I have reviewed the data as listed  CBC Latest Ref Rng & Units 11/18/2017 08/18/2017 05/19/2017  WBC 4.0 - 10.3 K/uL 6.6 7.1 3.4(L)  Hemoglobin 13.0 - 17.1 g/dL - 14.1 15.9  Hematocrit 38.4 - 49.9 % 42.2 43.2 43.9  Platelets 140 - 400 K/uL 160 199 146   . CBC    Component Value Date/Time   WBC 6.6 11/18/2017 0921   WBC 7.1 08/18/2017 0840   WBC 7.6 03/02/2017 1320   RBC 4.86 11/18/2017 0921   HGB 14.1 08/18/2017 0840   HCT 42.2 11/18/2017 0921   HCT 43.2 08/18/2017 0840   PLT 160 11/18/2017 0921   PLT 199 08/18/2017 0840   MCV 86.8 11/18/2017 0921   MCV 87.3 08/18/2017 0840   MCH 29.0 11/18/2017 0921   MCHC 33.4 11/18/2017 0921   RDW 13.7 11/18/2017 0921   RDW 14.6 08/18/2017 0840   LYMPHSABS 1.4 11/18/2017 0921   LYMPHSABS 2.3 08/18/2017 0840   MONOABS 0.9 11/18/2017 0921   MONOABS 0.6 08/18/2017 0840   EOSABS 0.4 11/18/2017 0921   EOSABS 0.3 08/18/2017 0840   BASOSABS 0.0 11/18/2017 0921   BASOSABS 0.0 08/18/2017 0840    . CMP Latest Ref Rng & Units 11/18/2017 08/18/2017 05/19/2017  Glucose 70 - 140 mg/dL 93 82 86  BUN 7 - 26 mg/dL 14 16.5 12.7  Creatinine 0.70 - 1.30 mg/dL 1.18 1.2 1.2  Sodium 136 - 145 mmol/L 141 142 141  Potassium 3.5 - 5.1 mmol/L 4.1 4.0 4.1  Chloride 98 - 109 mmol/L 106 - -  CO2  22 - 29 mmol/L 28 26 24   Calcium 8.4 - 10.4 mg/dL 9.2 9.3 9.2  Total Protein 6.4 - 8.3 g/dL 7.2 7.3 6.8  Total Bilirubin 0.2 - 1.2 mg/dL 0.3 0.61 0.35  Alkaline Phos 40 - 150 U/L 75 72 72  AST 5 - 34 U/L 22 21 13   ALT 0 - 55 U/L 18 17 12        RADIOGRAPHIC STUDIES: I have personally reviewed the radiological images as listed and agreed with the findings in the report.  No results found.  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 Stage IVA as per PET/CT  scan suggestive of bone marrow involvement. Sedimentation rate 43 (on diagnosis) - Now normalized with treatment  PET/CT post treatment completion on 02/16/2017 shows CR status with no residual disease > Deauville 1.  #2 Elevated transaminases after C1D1 treatment - now normalized  and has remained normal on subsequent checks.   Plan             -Labs today stable. No residual toxicities from chemotherapy. -Patient has no clinical or lab evidence of [name] progression at this time. Sed rate WNL -Patient has no plans for having children in the near future and he previously was recommended to wait for at least one year preferably 2 years though we discussed that there arent very rigid guidelines regarding this. -I recommend Prevnar and PCV vaccination to which the pt will obtain with his PCP. -Continue age-appropriate cancer screening with PCP -Will follow up with labs, every 3 months.   RTC with Dr Jagger Beahm in 3 months with labs  I spent 15 minutes counseling the patient face to face. The total time spent in the appointment was 20 minutes and more than 50% was on counseling regarding survivorship and active surveillance guidelines and direct patient cares.   Sullivan Lone MD [place] AAHIVMS [facility] Hematology/Oncology Physician [place]  (Office):        [phone] (Work cell):  [phone] (Fax):           [phone]  11/18/17 This document serves as a record of services personally performed by Sullivan Lone, MD. It was created on his behalf by Baldwin Jamaica, a trained medical scribe. The creation of this record is based on the scribe's personal observations and the provider's statements to them.   .I have reviewed the above documentation for accuracy and completeness, and I agree with the above.  Brunetta Genera MD MS

## 2017-11-18 ENCOUNTER — Inpatient Hospital Stay: Payer: 59 | Attending: Hematology | Admitting: Hematology

## 2017-11-18 ENCOUNTER — Inpatient Hospital Stay: Payer: 59

## 2017-11-18 ENCOUNTER — Encounter: Payer: Self-pay | Admitting: Hematology

## 2017-11-18 VITALS — BP 123/65 | HR 106 | Temp 98.4°F | Resp 18 | Ht 73.0 in | Wt 292.1 lb

## 2017-11-18 DIAGNOSIS — C8198 Hodgkin lymphoma, unspecified, lymph nodes of multiple sites: Secondary | ICD-10-CM

## 2017-11-18 DIAGNOSIS — C8101 Nodular lymphocyte predominant Hodgkin lymphoma, lymph nodes of head, face, and neck: Secondary | ICD-10-CM

## 2017-11-18 DIAGNOSIS — Z8572 Personal history of non-Hodgkin lymphomas: Secondary | ICD-10-CM

## 2017-11-18 DIAGNOSIS — C8118 Nodular sclerosis classical Hodgkin lymphoma, lymph nodes of multiple sites: Secondary | ICD-10-CM

## 2017-11-18 LAB — COMPREHENSIVE METABOLIC PANEL
ALK PHOS: 75 U/L (ref 40–150)
ALT: 18 U/L (ref 0–55)
ANION GAP: 7 (ref 3–11)
AST: 22 U/L (ref 5–34)
Albumin: 3.8 g/dL (ref 3.5–5.0)
BUN: 14 mg/dL (ref 7–26)
CALCIUM: 9.2 mg/dL (ref 8.4–10.4)
CO2: 28 mmol/L (ref 22–29)
Chloride: 106 mmol/L (ref 98–109)
Creatinine, Ser: 1.18 mg/dL (ref 0.70–1.30)
GFR calc non Af Amer: >60 mL/min (ref >60)
Glucose, Bld: 93 mg/dL (ref 70–140)
Potassium: 4.1 mmol/L (ref 3.5–5.1)
SODIUM: 141 mmol/L (ref 136–145)
Total Bilirubin: 0.3 mg/dL (ref 0.2–1.2)
Total Protein: 7.2 g/dL (ref 6.4–8.3)

## 2017-11-18 LAB — SEDIMENTATION RATE: SED RATE: 6 mm/h (ref 0–16)

## 2017-11-18 LAB — CBC WITH DIFFERENTIAL (CANCER CENTER ONLY)
BASOS ABS: 0 10*3/uL (ref 0.0–0.1)
Basophils Relative: 0 %
Eosinophils Absolute: 0.4 10*3/uL (ref 0.0–0.5)
Eosinophils Relative: 6 %
HCT: 42.2 % (ref 38.4–49.9)
HEMOGLOBIN: 14.1 g/dL (ref 13.0–17.1)
LYMPHS PCT: 21 %
Lymphs Abs: 1.4 10*3/uL (ref 0.9–3.3)
MCH: 29 pg (ref 27.2–33.4)
MCHC: 33.4 g/dL (ref 32.0–36.0)
MCV: 86.8 fL (ref 79.3–98.0)
Monocytes Absolute: 0.9 10*3/uL (ref 0.1–0.9)
Monocytes Relative: 14 %
NEUTROS ABS: 3.9 10*3/uL (ref 1.5–6.5)
NEUTROS PCT: 59 %
Platelet Count: 160 10*3/uL (ref 140–400)
RBC: 4.86 MIL/uL (ref 4.20–5.82)
RDW: 13.7 % (ref 11.0–15.6)
WBC: 6.6 10*3/uL (ref 4.0–10.3)

## 2017-11-18 LAB — RETICULOCYTES
RBC.: 4.72 MIL/uL (ref 4.22–5.81)
Retic Count, Absolute: 47.2 10*3/uL (ref 19.0–186.0)
Retic Ct Pct: 1 % (ref 0.4–3.1)

## 2018-02-10 ENCOUNTER — Ambulatory Visit: Payer: 59 | Admitting: Hematology

## 2018-02-10 ENCOUNTER — Other Ambulatory Visit: Payer: 59

## 2018-03-09 NOTE — Progress Notes (Signed)
Craig Schmitt    HEMATOLOGY/ONCOLOGY CLINIC NOTE  Date of Service: 03/13/18  Patient Care Team: Patient, No Pcp Per as PCP - General (General Practice)  CHIEF COMPLAINTS/PURPOSE OF CONSULTATION:  F/u for classical [name]   Diagnosis Classical [name] nodular sclerosis type with no type b constitutional symptoms. Stage IVA as per PET/CT scan suggestive of bone marrow involvement.  Treatment ABVD x 3 cycles -> AVD X 3 cycles Completed treatment 01/28/2017  HISTORY OF PRESENTING ILLNESS:  Plz see previous note for details.  INTERVAL HISTORY  Craig Schmitt is here for schedule 3 month follow-up after completion of ABVD->AVD in March 2018. [name] was on 11/18/17. The pt reports that he is doing well overall.   The pt reports no new concerns and has not noticed any new lumps or bumps. 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 He denies taking any new medications and notes that his allergies have been much better this year.   Lab results today (03/13/18) of CBC, CMP, and Reticulocytes is as follows: all values are WNL except for Chloride at 110. Sed Rate 03/13/18 is at 0.   On review of systems, pt reports good energy levels, and denies fevers, chills, night sweats, noticing any new lumps or bumps, mouth sores, pain along the spine, back pain, skin rashes, leg swelling, and any other symptoms.   MEDICAL HISTORY:  1)Hives to pollen  SURGICAL HISTORY: Past Surgical History:  Procedure Laterality Date  . IR GENERIC HISTORICAL  08/02/2016   IR FLUORO GUIDE PORT INSERTION RIGHT 08/02/2016 [name], MD [facility]  . IR GENERIC HISTORICAL  08/02/2016   IR US GUIDE VASC ACCESS RIGHT 08/02/2016 [name], MD [facility]  . IR REMOVAL TUN ACCESS W/ PORT W/O FL MOD SED  03/02/2017  . KNEE ARTHROSCOPY    . LYMPH GLAND EXCISION Left 07/20/2016   Procedure: LEFT CERVICAL LYMPH NODE BIOPSY;  Surgeon: Jackolyn Confer, MD;   Location: [facility];  Service: General;  Laterality: Left;    SOCIAL HISTORY: Social History   Socioeconomic History  . Marital status: Single    Spouse name: Not on file  . Number of children: Not on file  . Years of education: Not on file  . Highest education level: Not on file  Occupational History  . Not on file  Social Needs  . Financial resource strain: Not on file  . Food insecurity:    Worry: Not on file    Inability: Not on file  . Transportation needs:    Medical: Not on file    Non-medical: Not on file  Tobacco Use  . Smoking status: Never Smoker  . Smokeless tobacco: Never Used  Substance and Sexual Activity  . Alcohol use: No  . Drug use: No  . Sexual activity: Not on file  Lifestyle  . Physical activity:    Days per week: Not on file    Minutes per session: Not on file  . Stress: Not on file  Relationships  . Social connections:    Talks on phone: Not on file    Gets together: Not on file    Attends religious service: Not on file    Active member of club or organization: Not on file    Attends meetings of clubs or organizations: Not on file    Relationship status: Not on file  . Intimate partner violence:    Fear of  current or ex partner: Not on file    Emotionally abused: Not on file    Physically abused: Not on file    Forced sexual activity: Not on file  Other Topics Concern  . Not on file  Social History Narrative  . Not on file  Never smoker   no significant alcohol use  FAMILY HISTORY: family history of hypertension  No known family history of blood disorders or cancer .  ALLERGIES:  has No Known Allergies.  Allergic to pollen no other known drug or food allergies .  MEDICATIONS:  Current Outpatient Medications  Medication Sig Dispense Refill  . ibuprofen (ADVIL,MOTRIN) 200 MG tablet Take 200 mg by mouth every 6 (six) hours as needed for mild pain.     No current facility-administered medications for this visit.      REVIEW OF SYSTEMS:   A 10+ POINT REVIEW OF SYSTEMS WAS OBTAINED including neurology, dermatology, psychiatry, cardiac, respiratory, lymph, extremities, GI, GU, Musculoskeletal, constitutional, breasts, reproductive, HEENT.  All pertinent positives are noted in the HPI.  All others are negative.    PHYSICAL EXAMINATION:  ECOG PERFORMANCE STATUS: 0 - Asymptomatic  Vitals:   03/13/18 1028  BP: 136/67  Pulse: 61  Resp: 17  Temp: 97.9 F (36.6 C)  SpO2: 98%   Filed Weights   03/13/18 1028  Weight: 284 lb 3.2 oz (128.9 kg)   Body mass index is 37.5 kg/m. . Wt Readings from Last 3 Encounters:  03/13/18 284 lb 3.2 oz (128.9 kg)  11/18/17 292 lb 1.6 oz (132.5 kg)  08/18/17 270 lb 8 oz (122.7 kg)   GENERAL:alert, well-built African-American gentleman in no acute distress and comfortable SKIN: no acute rashes, no significant lesions EYES: conjunctiva are pink and non-injected, sclera anicteric OROPHARYNX: MMM, no exudates, no oropharyngeal erythema or ulceration NECK: supple, no JVD LYMPH:  no palpable lymphadenopathy in the cervical, axillary or inguinal regions LUNGS: clear to auscultation b/l with normal respiratory effort HEART: regular rate & rhythm ABDOMEN:  normoactive bowel sounds , non tender, not distended, no palpable hepatosplenomegaly.  Extremity: no pedal edema PSYCH: alert & oriented x 3 with fluent speech NEURO: no focal motor/sensory deficits  LABORATORY DATA:  I have reviewed the data as listed  CBC Latest Ref Rng & Units 03/13/2018 11/18/2017 08/18/2017  WBC 4.0 - 10.3 K/uL 5.3 6.6 7.1  Hemoglobin 13.0 - 17.1 g/dL 14.2 14.1 14.1  Hematocrit 38.4 - 49.9 % 44.0 42.2 43.2  Platelets 140 - 400 K/uL 179 160 199   . CBC    Component Value Date/Time   WBC 5.3 03/13/2018 1003   WBC 7.1 08/18/2017 0840   WBC 7.6 03/02/2017 1320   RBC 4.95 03/13/2018 1003   RBC 4.95 03/13/2018 1003   HGB 14.2 03/13/2018 1003   HGB 14.1 08/18/2017 0840   HCT 44.0  03/13/2018 1003   HCT 43.2 08/18/2017 0840   PLT 179 03/13/2018 1003   PLT 199 08/18/2017 0840   MCV 88.9 03/13/2018 1003   MCV 87.3 08/18/2017 0840   MCH 28.7 03/13/2018 1003   MCHC 32.3 03/13/2018 1003   RDW 13.9 03/13/2018 1003   RDW 14.6 08/18/2017 0840   LYMPHSABS 2.3 03/13/2018 1003   LYMPHSABS 2.3 08/18/2017 0840   MONOABS 0.4 03/13/2018 1003   MONOABS 0.6 08/18/2017 0840   EOSABS 0.5 03/13/2018 1003   EOSABS 0.3 08/18/2017 0840   BASOSABS 0.0 03/13/2018 1003   BASOSABS 0.0 08/18/2017 0840    . CMP Latest Ref  Rng & Units 03/13/2018 11/18/2017 08/18/2017  Glucose 70 - 140 mg/dL 71 93 82  BUN 7 - 26 mg/dL 16 14 16.5  Creatinine 0.70 - 1.30 mg/dL 1.22 1.18 1.2  Sodium 136 - 145 mmol/L 143 141 142  Potassium 3.5 - 5.1 mmol/L 3.9 4.1 4.0  Chloride 98 - 109 mmol/L 110(H) 106 -  CO2 22 - 29 mmol/L 26 28 26   Calcium 8.4 - 10.4 mg/dL 9.2 9.2 9.3  Total Protein 6.4 - 8.3 g/dL 7.4 7.2 7.3  Total Bilirubin 0.2 - 1.2 mg/dL 0.3 0.3 0.61  Alkaline Phos 40 - 150 U/L 71 75 72  AST 5 - 34 U/L 18 22 21   ALT 0 - 55 U/L 16 18 17        RADIOGRAPHIC STUDIES: I have personally reviewed the radiological images as listed and agreed with the findings in the report.  No results found.  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 Stage IVA as per PET/CT scan suggestive of bone marrow involvement. Sedimentation rate 43 (on diagnosis) - Now normalized with treatment  PET/CT post treatment completion on 02/16/2017 shows CR status with no residual disease > Deauville 1.  #2 Elevated transaminases after C1D1 treatment - now normalized  and has remained normal on subsequent checks.   Plan       -Discussed pt labwork today, 03/13/18; blood counts and chemistries are stable. Sed rate WNL -Will continue to see pt every 4 months -The pt shows no clinical  or lab progression of [name] at this time.  -No indication for further treatment at this time. -I recommend Prevnar and PCV vaccination to which the pt will obtain with his PCP. -Continue age-appropriate cancer screening with PCP   RTC with Dr Irene Limbo in 4 months with labs  . The total time spent in the appointment was 15 minutes and more than 50% was on counseling and direct patient cares.   Sullivan Lone MD [place] AAHIVMS [facility] Hematology/Oncology Physician [place]  (Office):       [phone] (Work cell):  [phone] (Fax):           [phone]  This document serves as a record of services personally performed by Sullivan Lone, MD. It was created on his behalf by Baldwin Jamaica, a trained medical scribe. The creation of this record is based on the scribe's personal observations and the provider's statements to them.   .I have reviewed the above documentation for accuracy and completeness, and I agree with the above. Brunetta Genera MD MS

## 2018-03-13 ENCOUNTER — Telehealth: Payer: Self-pay | Admitting: Hematology

## 2018-03-13 ENCOUNTER — Inpatient Hospital Stay (HOSPITAL_BASED_OUTPATIENT_CLINIC_OR_DEPARTMENT_OTHER): Payer: 59 | Admitting: Hematology

## 2018-03-13 ENCOUNTER — Inpatient Hospital Stay: Payer: 59 | Attending: Hematology

## 2018-03-13 VITALS — BP 136/67 | HR 61 | Temp 97.9°F | Resp 17 | Ht 73.0 in | Wt 284.2 lb

## 2018-03-13 DIAGNOSIS — Z8571 Personal history of Hodgkin lymphoma: Secondary | ICD-10-CM | POA: Insufficient documentation

## 2018-03-13 DIAGNOSIS — C8101 Nodular lymphocyte predominant Hodgkin lymphoma, lymph nodes of head, face, and neck: Secondary | ICD-10-CM

## 2018-03-13 DIAGNOSIS — C8198 Hodgkin lymphoma, unspecified, lymph nodes of multiple sites: Secondary | ICD-10-CM

## 2018-03-13 LAB — RETICULOCYTES
RBC.: 4.95 MIL/uL (ref 4.20–5.82)
Retic Count, Absolute: 39.6 10*3/uL (ref 34.8–93.9)
Retic Ct Pct: 0.8 % (ref 0.8–1.8)

## 2018-03-13 LAB — CMP (CANCER CENTER ONLY)
ALBUMIN: 4.2 g/dL (ref 3.5–5.0)
ALT: 16 U/L (ref 0–55)
ANION GAP: 7 (ref 3–11)
AST: 18 U/L (ref 5–34)
Alkaline Phosphatase: 71 U/L (ref 40–150)
BUN: 16 mg/dL (ref 7–26)
CALCIUM: 9.2 mg/dL (ref 8.4–10.4)
CHLORIDE: 110 mmol/L — AB (ref 98–109)
CO2: 26 mmol/L (ref 22–29)
Creatinine: 1.22 mg/dL (ref 0.70–1.30)
GFR, Estimated: >60 mL/min (ref >60)
GLUCOSE: 71 mg/dL (ref 70–140)
POTASSIUM: 3.9 mmol/L (ref 3.5–5.1)
SODIUM: 143 mmol/L (ref 136–145)
Total Bilirubin: 0.3 mg/dL (ref 0.2–1.2)
Total Protein: 7.4 g/dL (ref 6.4–8.3)

## 2018-03-13 LAB — CBC WITH DIFFERENTIAL (CANCER CENTER ONLY)
BASOS PCT: 0 %
Basophils Absolute: 0 10*3/uL (ref 0.0–0.1)
Eosinophils Absolute: 0.5 10*3/uL (ref 0.0–0.5)
Eosinophils Relative: 10 %
HEMATOCRIT: 44 % (ref 38.4–49.9)
HEMOGLOBIN: 14.2 g/dL (ref 13.0–17.1)
LYMPHS ABS: 2.3 10*3/uL (ref 0.9–3.3)
LYMPHS PCT: 42 %
MCH: 28.7 pg (ref 27.2–33.4)
MCHC: 32.3 g/dL (ref 32.0–36.0)
MCV: 88.9 fL (ref 79.3–98.0)
MONO ABS: 0.4 10*3/uL (ref 0.1–0.9)
MONOS PCT: 8 %
NEUTROS ABS: 2.1 10*3/uL (ref 1.5–6.5)
NEUTROS PCT: 40 %
Platelet Count: 179 10*3/uL (ref 140–400)
RBC: 4.95 MIL/uL (ref 4.20–5.82)
RDW: 13.9 % (ref 11.0–14.6)
WBC Count: 5.3 10*3/uL (ref 4.0–10.3)

## 2018-03-13 LAB — SEDIMENTATION RATE: SED RATE: 0 mm/h (ref 0–16)

## 2018-03-13 NOTE — Telephone Encounter (Signed)
Scheduled appt per 5/13 los - pt aware - no print out wanted

## 2018-07-13 NOTE — Progress Notes (Signed)
Craig Schmitt    HEMATOLOGY/ONCOLOGY CLINIC NOTE  Date of Service: 07/13/2018   Patient Care Team: Patient, No Pcp Per as PCP - General (General Practice)  CHIEF COMPLAINTS/PURPOSE OF CONSULTATION:  F/u for classical [name]   Diagnosis Classical [name] nodular sclerosis type with no type b constitutional symptoms. Stage IVA as per PET/CT scan suggestive of bone marrow involvement.  Treatment ABVD x 3 cycles -> AVD X 3 cycles Completed treatment 01/28/2017  HISTORY OF PRESENTING ILLNESS:  Plz see previous note for details.  INTERVAL HISTORY   Craig Schmitt is here for schedule 4 month follow-up for f/u of his hodgkins lymphoma after completion of ABVD->AVD in March 2018. [name] was on 03/13/18. He presents to the clinic today by himself.  He notes he is doing well. He denies any new concerns, symptoms, lumps or bumps. He notes he has been busy with work but tolerating well.  He notes he plans to do a 5K next month.   He denies change in bowel or urinary habits and no abdominal pain.   MEDICAL HISTORY:  1) Hives to pollen  SURGICAL HISTORY: Past Surgical History:  Procedure Laterality Date  . IR GENERIC HISTORICAL  08/02/2016   IR FLUORO GUIDE PORT INSERTION RIGHT 08/02/2016 [name], MD [facility]  . IR GENERIC HISTORICAL  08/02/2016   IR US GUIDE VASC ACCESS RIGHT 08/02/2016 [name], MD [facility]  . IR REMOVAL TUN ACCESS W/ PORT W/O FL MOD SED  03/02/2017  . KNEE ARTHROSCOPY    . LYMPH GLAND EXCISION Left 07/20/2016   Procedure: LEFT CERVICAL LYMPH NODE BIOPSY;  Surgeon: Jackolyn Confer, MD;  Location: [facility];  Service: General;  Laterality: Left;    SOCIAL HISTORY: Social History   Socioeconomic History  . Marital status: Single    Spouse name: Not on file  . Number of children: Not on file  . Years of education: Not on file  . Highest education level: Not on file  Occupational History  . Not  on file  Social Needs  . Financial resource strain: Not on file  . Food insecurity:    Worry: Not on file    Inability: Not on file  . Transportation needs:    Medical: Not on file    Non-medical: Not on file  Tobacco Use  . Smoking status: Never Smoker  . Smokeless tobacco: Never Used  Substance and Sexual Activity  . Alcohol use: No  . Drug use: No  . Sexual activity: Not on file  Lifestyle  . Physical activity:    Days per week: Not on file    Minutes per session: Not on file  . Stress: Not on file  Relationships  . Social connections:    Talks on phone: Not on file    Gets together: Not on file    Attends religious service: Not on file    Active member of club or organization: Not on file    Attends meetings of clubs or organizations: Not on file    Relationship status: Not on file  . Intimate partner violence:    Fear of current or ex partner: Not on file    Emotionally abused: Not on file    Physically abused: Not on file    Forced sexual activity: Not on file  Other Topics Concern  . Not on file  Social History Narrative  . Not on file  Never smoker   no  significant alcohol use  FAMILY HISTORY: family history of hypertension  No known family history of blood disorders or cancer .  ALLERGIES:  has No Known Allergies.  Allergic to pollen no other known drug or food allergies .  MEDICATIONS:  Current Outpatient Medications  Medication Sig Dispense Refill  . ibuprofen (ADVIL,MOTRIN) 200 MG tablet Take 200 mg by mouth every 6 (six) hours as needed for mild pain.     No current facility-administered medications for this visit.     REVIEW OF SYSTEMS:   A 10+ POINT REVIEW OF SYSTEMS WAS OBTAINED including neurology, dermatology, psychiatry, cardiac, respiratory, lymph, extremities, GI, GU, Musculoskeletal, constitutional, breasts, reproductive, HEENT.  All pertinent positives are noted in the HPI.  All others are negative.    PHYSICAL EXAMINATION:  ECOG  PERFORMANCE STATUS: 0 - Asymptomatic  Vitals:   07/14/18 1102  BP: 130/77  Pulse: (!) 48  Resp: 18  Temp: 97.7 F (36.5 C)  SpO2: 99%   Filed Weights   07/14/18 1102  Weight: 273 lb 3.2 oz (123.9 kg)   Body mass index is 36.04 kg/m. . Wt Readings from Last 3 Encounters:  07/14/18 273 lb 3.2 oz (123.9 kg)  03/13/18 284 lb 3.2 oz (128.9 kg)  11/18/17 292 lb 1.6 oz (132.5 kg)    GENERAL:alert, well-built African-American gentleman in no acute distress and comfortable SKIN: no acute rashes, no significant lesions EYES: conjunctiva are pink and non-injected, sclera anicteric OROPHARYNX: MMM, no exudates, no oropharyngeal erythema or ulceration NECK: supple, no JVD LYMPH:  no palpable lymphadenopathy in the cervical, axillary or inguinal regions LUNGS: clear to auscultation b/l with normal respiratory effort HEART: regular rate & rhythm ABDOMEN:  normoactive bowel sounds , non tender, not distended, no palpable hepatosplenomegaly.  Extremity: no pedal edema PSYCH: alert & oriented x 3 with fluent speech NEURO: no focal motor/sensory deficits  LABORATORY DATA:  I have reviewed the data as listed  CBC Latest Ref Rng & Units 03/13/2018 11/18/2017 08/18/2017  WBC 4.0 - 10.3 K/uL 5.3 6.6 7.1  Hemoglobin 13.0 - 17.1 g/dL 14.2 14.1 14.1  Hematocrit 38.4 - 49.9 % 44.0 42.2 43.2  Platelets 140 - 400 K/uL 179 160 199   . CBC    Component Value Date/Time   WBC 5.3 03/13/2018 1003   WBC 7.1 08/18/2017 0840   WBC 7.6 03/02/2017 1320   RBC 4.95 03/13/2018 1003   RBC 4.95 03/13/2018 1003   HGB 14.2 03/13/2018 1003   HGB 14.1 08/18/2017 0840   HCT 44.0 03/13/2018 1003   HCT 43.2 08/18/2017 0840   PLT 179 03/13/2018 1003   PLT 199 08/18/2017 0840   MCV 88.9 03/13/2018 1003   MCV 87.3 08/18/2017 0840   MCH 28.7 03/13/2018 1003   MCHC 32.3 03/13/2018 1003   RDW 13.9 03/13/2018 1003   RDW 14.6 08/18/2017 0840   LYMPHSABS 2.3 03/13/2018 1003   LYMPHSABS 2.3 08/18/2017 0840    MONOABS 0.4 03/13/2018 1003   MONOABS 0.6 08/18/2017 0840   EOSABS 0.5 03/13/2018 1003   EOSABS 0.3 08/18/2017 0840   BASOSABS 0.0 03/13/2018 1003   BASOSABS 0.0 08/18/2017 0840    . CMP Latest Ref Rng & Units 03/13/2018 11/18/2017 08/18/2017  Glucose 70 - 140 mg/dL 71 93 82  BUN 7 - 26 mg/dL 16 14 16.5  Creatinine 0.70 - 1.30 mg/dL 1.22 1.18 1.2  Sodium 136 - 145 mmol/L 143 141 142  Potassium 3.5 - 5.1 mmol/L 3.9 4.1 4.0  Chloride 98 -  109 mmol/L 110(H) 106 -  CO2 22 - 29 mmol/L 26 28 26   Calcium 8.4 - 10.4 mg/dL 9.2 9.2 9.3  Total Protein 6.4 - 8.3 g/dL 7.4 7.2 7.3  Total Bilirubin 0.2 - 1.2 mg/dL 0.3 0.3 0.61  Alkaline Phos 40 - 150 U/L 71 75 72  AST 5 - 34 U/L 18 22 21   ALT 0 - 55 U/L 16 18 17    Sed rate 1    RADIOGRAPHIC STUDIES: I have personally reviewed the radiological images as listed and agreed with the findings in the report.  No results found.  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 Stage IVA as per PET/CT scan suggestive of bone marrow involvement. Sedimentation rate 43 (on diagnosis) - Now normalized with treatment  PET/CT post treatment completion on 02/16/2017 shows CR status with no residual disease > Deauville 1.  #2 Elevated transaminases after C1D1 treatment - now normalized  and has remained normal on subsequent checks.   Plan      -Discussed pt labwork today, 07/14/18; blood counts and chemistries are WNL, except her Cr at 1.36. I encouraged him to drink plenty of water as he may be dehydrated.  Sed rate 1 -The pt shows no clinical or lab progression of [name] at this time.  -No indication for further treatment at this time. -I recommend Prevnar and PCV vaccination to which the pt will obtain with his PCP. -Continue age-appropriate cancer screening with PCP   RTC with Dr Irene Limbo in 4 months  with labs  . The total time spent in the appointment was 20 minutes and more than 50% was on counseling and direct patient cares.     Sullivan Lone MD [place] AAHIVMS [facility] Hematology/Oncology Physician [facility]  (Office):       [phone] (Work cell):  [phone] (Fax):           [phone]  I, Joslyn Devon, am acting as scribe for Sullivan Lone, MD.    .I have reviewed the above documentation for accuracy and completeness, and I agree with the above.   Brunetta Genera MD MS

## 2018-07-14 ENCOUNTER — Encounter: Payer: Self-pay | Admitting: Hematology

## 2018-07-14 ENCOUNTER — Telehealth: Payer: Self-pay | Admitting: Hematology

## 2018-07-14 ENCOUNTER — Inpatient Hospital Stay: Payer: 59 | Attending: Hematology | Admitting: Hematology

## 2018-07-14 ENCOUNTER — Inpatient Hospital Stay: Payer: 59

## 2018-07-14 VITALS — BP 130/77 | HR 48 | Temp 97.7°F | Resp 18 | Ht 73.0 in | Wt 273.2 lb

## 2018-07-14 DIAGNOSIS — Z8571 Personal history of Hodgkin lymphoma: Secondary | ICD-10-CM | POA: Diagnosis not present

## 2018-07-14 DIAGNOSIS — C8111 Nodular sclerosis classical Hodgkin lymphoma, lymph nodes of head, face, and neck: Secondary | ICD-10-CM

## 2018-07-14 DIAGNOSIS — C8198 Hodgkin lymphoma, unspecified, lymph nodes of multiple sites: Secondary | ICD-10-CM

## 2018-07-14 LAB — CBC WITH DIFFERENTIAL/PLATELET
BASOS ABS: 0 10*3/uL (ref 0.0–0.1)
BASOS PCT: 0 %
EOS ABS: 0.4 10*3/uL (ref 0.0–0.5)
EOS PCT: 6 %
HCT: 44.9 % (ref 38.4–49.9)
Hemoglobin: 15 g/dL (ref 13.0–17.1)
LYMPHS PCT: 37 %
Lymphs Abs: 2.5 10*3/uL (ref 0.9–3.3)
MCH: 29.6 pg (ref 27.2–33.4)
MCHC: 33.4 g/dL (ref 32.0–36.0)
MCV: 88.6 fL (ref 79.3–98.0)
Monocytes Absolute: 0.6 10*3/uL (ref 0.1–0.9)
Monocytes Relative: 9 %
Neutro Abs: 3.1 10*3/uL (ref 1.5–6.5)
Neutrophils Relative %: 48 %
PLATELETS: 177 10*3/uL (ref 140–400)
RBC: 5.07 MIL/uL (ref 4.20–5.82)
RDW: 13.4 % (ref 11.0–14.6)
WBC: 6.6 10*3/uL (ref 4.0–10.3)

## 2018-07-14 LAB — CMP (CANCER CENTER ONLY)
ALT: 15 U/L (ref 0–44)
AST: 18 U/L (ref 15–41)
Albumin: 4.2 g/dL (ref 3.5–5.0)
Alkaline Phosphatase: 74 U/L (ref 38–126)
Anion gap: 8 (ref 5–15)
BILIRUBIN TOTAL: 0.7 mg/dL (ref 0.3–1.2)
BUN: 19 mg/dL (ref 6–20)
CALCIUM: 9.7 mg/dL (ref 8.9–10.3)
CO2: 27 mmol/L (ref 22–32)
CREATININE: 1.36 mg/dL — AB (ref 0.61–1.24)
Chloride: 106 mmol/L (ref 98–111)
GFR, Est AFR Am: >60 mL/min (ref >60)
Glucose, Bld: 89 mg/dL (ref 70–99)
POTASSIUM: 4.1 mmol/L (ref 3.5–5.1)
Sodium: 141 mmol/L (ref 135–145)
TOTAL PROTEIN: 7.7 g/dL (ref 6.5–8.1)

## 2018-07-14 LAB — SEDIMENTATION RATE: SED RATE: 1 mm/h (ref 0–16)

## 2018-07-14 NOTE — Telephone Encounter (Signed)
Scheduled appt per 9/13 los -pt aware of appts. - no print out wanted per patient

## 2018-10-08 IMAGING — PT NM PET TUM IMG RESTAG (PS) SKULL BASE T - THIGH
1 of 8 series · 1 of 25 positions shown · non-contrast
Comparison: [DATE]/[id number]

CLINICAL DATA: Subsequent treatment strategy for nodular sclerosis
[name].

EXAM:
NUCLEAR MEDICINE PET SKULL BASE TO THIGH
TECHNIQUE: 14.5 mCi F-18 FDG was injected intravenously. Full-ring PET imaging
was performed from the skull base to thigh after the radiotracer. CT
data was obtained and used for attenuation correction and anatomic
localization.
FASTING BLOOD GLUCOSE:  Value: 88 mg/dl

[Series 4: ct sk_thigh 5.0 hd_fov · axial · 5.0mm · 1.17mm/px · 1 of 234 slices shown]
[im 234/234  brain]
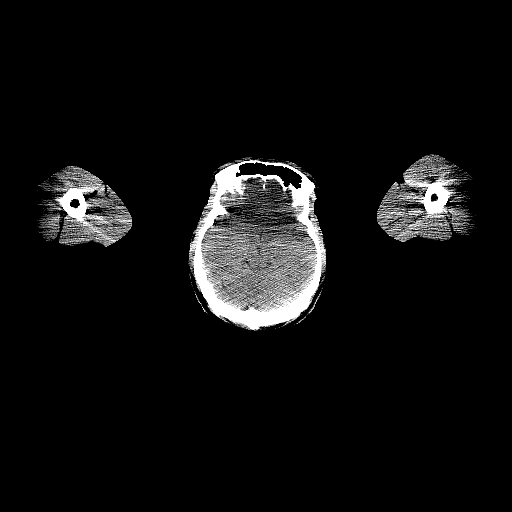

[1 of 25 positions shown; findings below may reference images not displayed]

FINDINGS: NECK

No hypermetabolic lymph nodes in the neck. Previous adenopathy has
resolved. Incidental polypoid mucoperiosteal thickening in the left
maxillary sinus.

CHEST

Previous adenopathy has essentially resolved. Linear streaky
opacities in the left axilla measuring up to 10 mm in thickness,
previously 22 mm, with metabolic activity up to maximum SUV of 2.6,
previously 27.0.

Prior right upper lobe subpleural nodule measuring 12 mm currently
has no hypermetabolic activity and currently measures 7 mm and this
sub solid in appearance.

ABDOMEN/PELVIS

No abnormal hypermetabolic activity within the liver, pancreas,
adrenal glands, or spleen. No hypermetabolic lymph nodes in the
abdomen or pelvis.

SKELETON

Previous hypermetabolic bony activity including the right posterior
iliac lesion previously shown has resolved. The right posterior
iliac bone lesion was previously lucent with maximum SUV 15.0, now
the lesion is much less conspicuous on CT and not hypermetabolic.
IMPRESSION: 1. Previous hypermetabolic adenopathy in the neck and chest has
resolved, previous abnormal osseous activity in the right posterior
iliac bone has resolved. The previously hypermetabolic 12 mm right
upper lobe pulmonary nodule now measures 7 mm and is sub solid and
not hypermetabolic. Overall appearance compatible with excellent
response to therapy.
2. Chronic left maxillary sinusitis.

## 2018-11-10 NOTE — Progress Notes (Signed)
Craig Schmitt    HEMATOLOGY/ONCOLOGY CLINIC NOTE  Date of Service: 11/13/2018   Patient Care Team: Patient, No Pcp Per as PCP - General (General Practice)  CHIEF COMPLAINTS/PURPOSE OF CONSULTATION:  F/u for classical [name]   Diagnosis Classical [name] nodular sclerosis type with no type b constitutional symptoms. Stage IVA as per PET/CT scan suggestive of bone marrow involvement.  Treatment ABVD x 3 cycles -> AVD X 3 cycles Completed treatment 01/28/2017  HISTORY OF PRESENTING ILLNESS:  Plz see previous note for details.  INTERVAL HISTORY   [name] is here for schedule 4 month follow-up for f/u of his hodgkins lymphoma after completion of ABVD->AVD in March 2018. [name] was on 07/14/18. The pt reports that he is doing well overall.   The pt reports that he has been doing very well in the interim and has been enjoying life. He notes that he has been more conscious of eating well and eating healthy, and has been drinking lots of water. He denies any fevers, chills, night sweats or noticing any new lumps or bumps. The pt continues to remain active with working out regularly. He denies any pain along the spine or leg swelling.   Lab results today (11/13/18) of CBC w/diff and CMP is as follows: all values are WNL. 11/13/18 Sed Rate is 1  On review of systems, pt reports good energy levels, eating well, staying active, and denies fevers, chills, night sweats, noticing any new lumps or bumps, pain along the spine, leg swelling, and any other symptoms.   MEDICAL HISTORY:  1) Hives to pollen  SURGICAL HISTORY: Past Surgical History:  Procedure Laterality Date  . IR GENERIC HISTORICAL  08/02/2016   IR FLUORO GUIDE PORT INSERTION RIGHT 08/02/2016 [name], MD [facility]  . IR GENERIC HISTORICAL  08/02/2016   IR US GUIDE VASC ACCESS RIGHT 08/02/2016 [name], MD [facility]  . IR REMOVAL TUN ACCESS W/ PORT W/O FL MOD SED  03/02/2017    . KNEE ARTHROSCOPY    . LYMPH GLAND EXCISION Left 07/20/2016   Procedure: LEFT CERVICAL LYMPH NODE BIOPSY;  Surgeon: Jackolyn Confer, MD;  Location: [place];  Service: General;  Laterality: Left;    SOCIAL HISTORY: Social History   Socioeconomic History  . Marital status: Single    Spouse name: Not on file  . Number of children: Not on file  . Years of education: Not on file  . Highest education level: Not on file  Occupational History  . Not on file  Social Needs  . Financial resource strain: Not on file  . Food insecurity:    Worry: Not on file    Inability: Not on file  . Transportation needs:    Medical: Not on file    Non-medical: Not on file  Tobacco Use  . Smoking status: Never Smoker  . Smokeless tobacco: Never Used  Substance and Sexual Activity  . Alcohol use: No  . Drug use: No  . Sexual activity: Not on file  Lifestyle  . Physical activity:    Days per week: Not on file    Minutes per session: Not on file  . Stress: Not on file  Relationships  . Social connections:    Talks on phone: Not on file    Gets together: Not on file    Attends religious service: Not on file    Active member of club or organization: Not on file  Attends meetings of clubs or organizations: Not on file    Relationship status: Not on file  . Intimate partner violence:    Fear of current or ex partner: Not on file    Emotionally abused: Not on file    Physically abused: Not on file    Forced sexual activity: Not on file  Other Topics Concern  . Not on file  Social History Narrative  . Not on file  Never smoker   no significant alcohol use  FAMILY HISTORY: family history of hypertension  No known family history of blood disorders or cancer .  ALLERGIES:  has No Known Allergies.  Allergic to pollen no other known drug or food allergies .  MEDICATIONS:  Current Outpatient Medications  Medication Sig Dispense Refill  . ibuprofen (ADVIL,MOTRIN) 200 MG  tablet Take 200 mg by mouth every 6 (six) hours as needed for mild pain.     No current facility-administered medications for this visit.     REVIEW OF SYSTEMS:    A 10+ POINT REVIEW OF SYSTEMS WAS OBTAINED including neurology, dermatology, psychiatry, cardiac, respiratory, lymph, extremities, GI, GU, Musculoskeletal, constitutional, breasts, reproductive, HEENT.  All pertinent positives are noted in the HPI.  All others are negative.   PHYSICAL EXAMINATION:  ECOG PERFORMANCE STATUS: 0 - Asymptomatic  Vitals:   11/13/18 0917  BP: 126/81  Pulse: (!) 42  Resp: 18  Temp: 97.6 F (36.4 C)  SpO2: 100%   Filed Weights   11/13/18 0917  Weight: 271 lb 11.2 oz (123.2 kg)   Body mass index is 35.85 kg/m. . Wt Readings from Last 3 Encounters:  11/13/18 271 lb 11.2 oz (123.2 kg)  07/14/18 273 lb 3.2 oz (123.9 kg)  03/13/18 284 lb 3.2 oz (128.9 kg)   GENERAL:alert, well-built African-American gentleman, in no acute distress and comfortable SKIN: no acute rashes, no significant lesions EYES: conjunctiva are pink and non-injected, sclera anicteric OROPHARYNX: MMM, no exudates, no oropharyngeal erythema or ulceration NECK: supple, no JVD LYMPH:  no palpable lymphadenopathy in the cervical, axillary or inguinal regions LUNGS: clear to auscultation b/l with normal respiratory effort HEART: regular rate & rhythm ABDOMEN:  normoactive bowel sounds , non tender, not distended. No palpable hepatosplenomegaly.  Extremity: no pedal edema PSYCH: alert & oriented x 3 with fluent speech NEURO: no focal motor/sensory deficits   LABORATORY DATA:  I have reviewed the data as listed  CBC Latest Ref Rng & Units 11/13/2018 07/14/2018 03/13/2018  WBC 4.0 - 10.5 K/uL 6.0 6.6 5.3  Hemoglobin 13.0 - 17.0 g/dL 13.8 15.0 14.2  Hematocrit 39.0 - 52.0 % 42.2 44.9 44.0  Platelets 150 - 400 K/uL 165 177 179   . CBC    Component Value Date/Time   WBC 6.0 11/13/2018 0848   RBC 4.76 11/13/2018 0848   HGB  13.8 11/13/2018 0848   HGB 14.2 03/13/2018 1003   HGB 14.1 08/18/2017 0840   HCT 42.2 11/13/2018 0848   HCT 43.2 08/18/2017 0840   PLT 165 11/13/2018 0848   PLT 179 03/13/2018 1003   PLT 199 08/18/2017 0840   MCV 88.7 11/13/2018 0848   MCV 87.3 08/18/2017 0840   MCH 29.0 11/13/2018 0848   MCHC 32.7 11/13/2018 0848   RDW 13.2 11/13/2018 0848   RDW 14.6 08/18/2017 0840   LYMPHSABS 2.1 11/13/2018 0848   LYMPHSABS 2.3 08/18/2017 0840   MONOABS 0.5 11/13/2018 0848   MONOABS 0.6 08/18/2017 0840   EOSABS 0.4 11/13/2018 0848  EOSABS 0.3 08/18/2017 0840   BASOSABS 0.0 11/13/2018 0848   BASOSABS 0.0 08/18/2017 0840    . CMP Latest Ref Rng & Units 11/13/2018 07/14/2018 03/13/2018  Glucose 70 - 99 mg/dL 85 89 71  BUN 6 - 20 mg/dL 19 19 16   Creatinine 0.61 - 1.24 mg/dL 1.24 1.36(H) 1.22  Sodium 135 - 145 mmol/L 140 141 143  Potassium 3.5 - 5.1 mmol/L 4.4 4.1 3.9  Chloride 98 - 111 mmol/L 107 106 110(H)  CO2 22 - 32 mmol/L 26 27 26   Calcium 8.9 - 10.3 mg/dL 9.0 9.7 9.2  Total Protein 6.5 - 8.1 g/dL 7.0 7.7 7.4  Total Bilirubin 0.3 - 1.2 mg/dL 0.4 0.7 0.3  Alkaline Phos 38 - 126 U/L 63 74 71  AST 15 - 41 U/L 16 18 18   ALT 0 - 44 U/L 19 15 16    Sed rate 1    RADIOGRAPHIC STUDIES: I have personally reviewed the radiological images as listed and agreed with the findings in the report.  No results found.  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 Stage IVA as per PET/CT scan suggestive of bone marrow involvement. Sedimentation rate 43 (on diagnosis) - Now normalized with treatment  PET/CT post treatment completion on 02/16/2017 shows CR status with no residual disease > [place] 1.  #2 Elevated transaminases after C1D1 treatment - now normalized  and has remained normal on subsequent checks.   Plan      -Discussed pt labwork today, 11/13/18;  blood counts and chemistries are normal. Sed rate is 1 WNL -The pt shows no clinical or lab progression of Nodular Sclerosis [name] at this time.  -No indication for further treatment at this time.   -Pt has not developed any concerns in his 2 years since completing treatment in March 2018. Will now space out follow ups to every 6 months.  -Recommended that the pt continue to eat well, drink at least 48-64 oz of water each day, and walk 20-30 minutes each day. -Recommend Prevnar and PCV vaccination to which the pt will obtain with his PCP. -Continue age-appropriate cancer screening with PCP -Will see the pt back in 6 months, sooner if any new concerns    RTC with Dr Irene Limbo with labs in 6 months    The total time spent in the appt was 20 minutes and more than 50% was on counseling and direct patient cares.   Sullivan Lone MD [place] AAHIVMS [facility] Hematology/Oncology Physician [facility]  (Office):       [phone] (Work cell):  [phone] (Fax):           [phone]  I, Baldwin [place], am acting as a scribe for Dr. Sullivan Lone.   .I have reviewed the above documentation for accuracy and completeness, and I agree with the above. Brunetta Genera MD

## 2018-11-13 ENCOUNTER — Inpatient Hospital Stay: Payer: 59

## 2018-11-13 ENCOUNTER — Telehealth: Payer: Self-pay | Admitting: Hematology

## 2018-11-13 ENCOUNTER — Inpatient Hospital Stay: Payer: 59 | Attending: Hematology | Admitting: Hematology

## 2018-11-13 VITALS — BP 126/81 | HR 42 | Temp 97.6°F | Resp 18 | Ht 73.0 in | Wt 271.7 lb

## 2018-11-13 DIAGNOSIS — Z8571 Personal history of Hodgkin lymphoma: Secondary | ICD-10-CM

## 2018-11-13 DIAGNOSIS — Z9221 Personal history of antineoplastic chemotherapy: Secondary | ICD-10-CM | POA: Diagnosis not present

## 2018-11-13 DIAGNOSIS — C8111 Nodular sclerosis classical Hodgkin lymphoma, lymph nodes of head, face, and neck: Secondary | ICD-10-CM

## 2018-11-13 LAB — CMP (CANCER CENTER ONLY)
ALT: 19 U/L (ref 0–44)
AST: 16 U/L (ref 15–41)
Albumin: 4 g/dL (ref 3.5–5.0)
Alkaline Phosphatase: 63 U/L (ref 38–126)
Anion gap: 7 (ref 5–15)
BUN: 19 mg/dL (ref 6–20)
CHLORIDE: 107 mmol/L (ref 98–111)
CO2: 26 mmol/L (ref 22–32)
Calcium: 9 mg/dL (ref 8.9–10.3)
Creatinine: 1.24 mg/dL (ref 0.61–1.24)
Glucose, Bld: 85 mg/dL (ref 70–99)
Potassium: 4.4 mmol/L (ref 3.5–5.1)
Sodium: 140 mmol/L (ref 135–145)
TOTAL PROTEIN: 7 g/dL (ref 6.5–8.1)
Total Bilirubin: 0.4 mg/dL (ref 0.3–1.2)

## 2018-11-13 LAB — CBC WITH DIFFERENTIAL/PLATELET
ABS IMMATURE GRANULOCYTES: 0 10*3/uL (ref 0.00–0.07)
BASOS PCT: 0 %
Basophils Absolute: 0 10*3/uL (ref 0.0–0.1)
Eosinophils Absolute: 0.4 10*3/uL (ref 0.0–0.5)
Eosinophils Relative: 7 %
HCT: 42.2 % (ref 39.0–52.0)
Hemoglobin: 13.8 g/dL (ref 13.0–17.0)
IMMATURE GRANULOCYTES: 0 %
Lymphocytes Relative: 35 %
Lymphs Abs: 2.1 10*3/uL (ref 0.7–4.0)
MCH: 29 pg (ref 26.0–34.0)
MCHC: 32.7 g/dL (ref 30.0–36.0)
MCV: 88.7 fL (ref 80.0–100.0)
Monocytes Absolute: 0.5 10*3/uL (ref 0.1–1.0)
Monocytes Relative: 8 %
NEUTROS ABS: 3.1 10*3/uL (ref 1.7–7.7)
NEUTROS PCT: 50 %
NRBC: 0 % (ref 0.0–0.2)
PLATELETS: 165 10*3/uL (ref 150–400)
RBC: 4.76 MIL/uL (ref 4.22–5.81)
RDW: 13.2 % (ref 11.5–15.5)
WBC: 6 10*3/uL (ref 4.0–10.5)

## 2018-11-13 LAB — SEDIMENTATION RATE: SED RATE: 1 mm/h (ref 0–16)

## 2018-11-13 NOTE — Telephone Encounter (Signed)
Patient declined avs and calendar  °

## 2019-05-15 NOTE — Progress Notes (Signed)
HEMATOLOGY/ONCOLOGY CLINIC NOTE  Date of Service: 05/16/2019   Patient Care Team: Patient, No Pcp Per as PCP - General (General Practice)  CHIEF COMPLAINTS/PURPOSE OF CONSULTATION:  F/u for classical [name]   Diagnosis Classical [name] nodular sclerosis type with no type b constitutional symptoms. Stage IVA as per PET/CT scan suggestive of bone marrow involvement.  Treatment ABVD x 3 cycles -> AVD X 3 cycles Completed treatment 01/28/2017  HISTORY OF PRESENTING ILLNESS:  Plz see previous note for details.   INTERVAL HISTORY   Mr Alkhatib is here for schedule 4 month follow-up for f/u of his hodgkins lymphoma after completion of ABVD->AVD in March 2018. [name] was on 11/13/2018. The pt reports that he is doing well overall.  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 He maintains an active lifestyle, and he likes to run around 3 to 5 miles per day.   Lab results today (05/16/19) of CBC w/diff and CMP is as follows: all values are WNL except for glucose of 108 and calcium of 8.8.  On review of systems, pt denies fever, chills, night sweats, or weight change and any other symptoms.    MEDICAL HISTORY:  1) Hives to pollen  SURGICAL HISTORY: Past Surgical History:  Procedure Laterality Date  . IR GENERIC HISTORICAL  08/02/2016   IR FLUORO GUIDE PORT INSERTION RIGHT 08/02/2016 [name], MD [facility]  . IR GENERIC HISTORICAL  08/02/2016   IR US GUIDE VASC ACCESS RIGHT 08/02/2016 [name], MD [facility]  . IR REMOVAL TUN ACCESS W/ PORT W/O FL MOD SED  03/02/2017  . KNEE ARTHROSCOPY    . LYMPH GLAND EXCISION Left 07/20/2016   Procedure: LEFT CERVICAL LYMPH NODE BIOPSY;  Surgeon: Jackolyn Confer, MD;  Location: [place];  Service: General;  Laterality: Left;    SOCIAL HISTORY: Social History   Socioeconomic History  .  Marital status: Single    Spouse name: Not on file  . Number of children: Not on file  . Years of education: Not on file  . Highest education level: Not on file  Occupational History  . Not on file  Social Needs  . Financial resource strain: Not on file  . Food insecurity    Worry: Not on file    Inability: Not on file  . Transportation needs    Medical: Not on file    Non-medical: Not on file  Tobacco Use  . Smoking status: Never Smoker  . Smokeless tobacco: Never Used  Substance and Sexual Activity  . Alcohol use: No  . Drug use: No  . Sexual activity: Not on file  Lifestyle  . Physical activity    Days per week: Not on file    Minutes per session: Not on file  . Stress: Not on file  Relationships  . Social Herbalist on phone: Not on file    Gets together: Not on file    Attends religious service: Not on file    Active member of club or organization: Not on file    Attends meetings of clubs or organizations: Not on file    Relationship status: Not on file  . Intimate partner violence    Fear of current or ex partner: Not on file    Emotionally abused: Not on file    Physically abused: Not on  file    Forced sexual activity: Not on file  Other Topics Concern  . Not on file  Social History Narrative  . Not on file  Never smoker   no significant alcohol use  FAMILY HISTORY: family history of hypertension  No known family history of blood disorders or cancer .  ALLERGIES:  has No Known Allergies.  Allergic to pollen no other known drug or food allergies .  MEDICATIONS:  Current Outpatient Medications  Medication Sig Dispense Refill  . ibuprofen (ADVIL,MOTRIN) 200 MG tablet Take 200 mg by mouth every 6 (six) hours as needed for mild pain.     No current facility-administered medications for this visit.     REVIEW OF SYSTEMS:   A 10+ POINT REVIEW OF SYSTEMS WAS OBTAINED including neurology, dermatology, psychiatry, cardiac, respiratory, lymph,  extremities, GI, GU, Musculoskeletal, constitutional, breasts, reproductive, HEENT.  All pertinent positives are noted in the HPI.  All others are negative.    PHYSICAL EXAMINATION:  ECOG PERFORMANCE STATUS: 0 - Asymptomatic  Vitals:   05/16/19 1041  BP: 128/78  Pulse: 60  Resp: 17  Temp: 98.7 F (37.1 C)  SpO2: 100%   Filed Weights   05/16/19 1041  Weight: 266 lb 12.8 oz (121 kg)   Body mass index is 35.2 kg/m. . Wt Readings from Last 3 Encounters:  05/16/19 266 lb 12.8 oz (121 kg)  11/13/18 271 lb 11.2 oz (123.2 kg)  07/14/18 273 lb 3.2 oz (123.9 kg)   GENERAL:alert, in no acute distress and comfortable SKIN: no acute rashes, no significant lesions EYES: conjunctiva are pink and non-injected, sclera anicteric OROPHARYNX: MMM, no exudates, no oropharyngeal erythema or ulceration NECK: supple, no JVD LYMPH:  no palpable lymphadenopathy in the cervical, axillary or inguinal regions LUNGS: clear to auscultation b/l with normal respiratory effort HEART: regular rate & rhythm ABDOMEN:  normoactive bowel sounds , non tender, not distended. Extremity: no pedal edema PSYCH: alert & oriented x 3 with fluent speech NEURO: no focal motor/sensory deficits    LABORATORY DATA:  I have reviewed the data as listed  CBC Latest Ref Rng & Units 05/16/2019 11/13/2018 07/14/2018  WBC 4.0 - 10.5 K/uL 6.1 6.0 6.6  Hemoglobin 13.0 - 17.0 g/dL 14.3 13.8 15.0  Hematocrit 39.0 - 52.0 % 43.6 42.2 44.9  Platelets 150 - 400 K/uL 183 165 177   . CBC    Component Value Date/Time   WBC 6.1 05/16/2019 1023   RBC 4.86 05/16/2019 1023   HGB 14.3 05/16/2019 1023   HGB 14.2 03/13/2018 1003   HGB 14.1 08/18/2017 0840   HCT 43.6 05/16/2019 1023   HCT 43.2 08/18/2017 0840   PLT 183 05/16/2019 1023   PLT 179 03/13/2018 1003   PLT 199 08/18/2017 0840   MCV 89.7 05/16/2019 1023   MCV 87.3 08/18/2017 0840   MCH 29.4 05/16/2019 1023   MCHC 32.8 05/16/2019 1023   RDW 13.0 05/16/2019 1023   RDW  14.6 08/18/2017 0840   LYMPHSABS 2.4 05/16/2019 1023   LYMPHSABS 2.3 08/18/2017 0840   MONOABS 0.5 05/16/2019 1023   MONOABS 0.6 08/18/2017 0840   EOSABS 0.4 05/16/2019 1023   EOSABS 0.3 08/18/2017 0840   BASOSABS 0.0 05/16/2019 1023   BASOSABS 0.0 08/18/2017 0840    . CMP Latest Ref Rng & Units 05/16/2019 11/13/2018 07/14/2018  Glucose 70 - 99 mg/dL 108(H) 85 89  BUN 6 - 20 mg/dL 17 19 19   Creatinine 0.61 - 1.24 mg/dL 1.19 1.24 1.36(H)  Sodium 135 - 145 mmol/L 139 140 141  Potassium 3.5 - 5.1 mmol/L 4.1 4.4 4.1  Chloride 98 - 111 mmol/L 106 107 106  CO2 22 - 32 mmol/L 24 26 27   Calcium 8.9 - 10.3 mg/dL 8.8(L) 9.0 9.7  Total Protein 6.5 - 8.1 g/dL 7.3 7.0 7.7  Total Bilirubin 0.3 - 1.2 mg/dL 0.5 0.4 0.7  Alkaline Phos 38 - 126 U/L 61 63 74  AST 15 - 41 U/L 21 16 18   ALT 0 - 44 U/L 23 19 15    Sed rate 1    RADIOGRAPHIC STUDIES: I have personally reviewed the radiological images as listed and agreed with the findings in the report.  No results found.  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 Stage IVA as per PET/CT scan suggestive of bone marrow involvement. Sedimentation rate 43 (on diagnosis) - Now normalized with treatment  PET/CT post treatment completion on 02/16/2017 shows CR status with no residual disease > [place] 1.  #2 Elevated transaminases after C1D1 treatment - now normalized  and has remained normal on subsequent checks.    Plan      -Discussed pt labwork today, 05/16/19; all values are WNL except for glucose of 108 and calcium of 8.8. -Discussed getting two pneumonia vaccines, Prevnar and pneumovax.  -Discussed routine flu shots -Discussed finding a PCP for routine checkups  FOLLOW UP: RTC with dr Irene Limbo with labs in 6 months    The total time spent in the appt was 25 minutes and more than 50% was on counseling  and direct patient cares.    Sullivan Lone MD [place] AAHIVMS [facility] Hematology/Oncology Physician [facility]  (Office):       [phone] (Work cell):  [phone] (Fax):           [phone]  I, Jacqualyn Posey, am acting as a scribe for Dr. Sullivan Lone.   .I have reviewed the above documentation for accuracy and completeness, and I agree with the above. Brunetta Genera MD

## 2019-05-16 ENCOUNTER — Inpatient Hospital Stay: Payer: 59 | Admitting: Hematology

## 2019-05-16 ENCOUNTER — Other Ambulatory Visit: Payer: Self-pay

## 2019-05-16 ENCOUNTER — Inpatient Hospital Stay: Payer: 59 | Attending: Hematology

## 2019-05-16 VITALS — BP 128/78 | HR 60 | Temp 98.7°F | Resp 17 | Ht 73.0 in | Wt 266.8 lb

## 2019-05-16 DIAGNOSIS — Z8571 Personal history of Hodgkin lymphoma: Secondary | ICD-10-CM | POA: Insufficient documentation

## 2019-05-16 DIAGNOSIS — C8111 Nodular sclerosis classical Hodgkin lymphoma, lymph nodes of head, face, and neck: Secondary | ICD-10-CM

## 2019-05-16 LAB — CMP (CANCER CENTER ONLY)
ALT: 23 U/L (ref 0–44)
AST: 21 U/L (ref 15–41)
Albumin: 4.1 g/dL (ref 3.5–5.0)
Alkaline Phosphatase: 61 U/L (ref 38–126)
Anion gap: 9 (ref 5–15)
BUN: 17 mg/dL (ref 6–20)
CO2: 24 mmol/L (ref 22–32)
Calcium: 8.8 mg/dL — ABNORMAL LOW (ref 8.9–10.3)
Chloride: 106 mmol/L (ref 98–111)
Creatinine: 1.19 mg/dL (ref 0.61–1.24)
GFR, Est AFR Am: >60 mL/min (ref >60)
GFR, Estimated: >60 mL/min (ref >60)
Glucose, Bld: 108 mg/dL — ABNORMAL HIGH (ref 70–99)
Potassium: 4.1 mmol/L (ref 3.5–5.1)
Sodium: 139 mmol/L (ref 135–145)
Total Bilirubin: 0.5 mg/dL (ref 0.3–1.2)
Total Protein: 7.3 g/dL (ref 6.5–8.1)

## 2019-05-16 LAB — CBC WITH DIFFERENTIAL/PLATELET
Abs Immature Granulocytes: 0.01 10*3/uL (ref 0.00–0.07)
Basophils Absolute: 0 10*3/uL (ref 0.0–0.1)
Basophils Relative: 0 %
Eosinophils Absolute: 0.4 10*3/uL (ref 0.0–0.5)
Eosinophils Relative: 6 %
HCT: 43.6 % (ref 39.0–52.0)
Hemoglobin: 14.3 g/dL (ref 13.0–17.0)
Immature Granulocytes: 0 %
Lymphocytes Relative: 39 %
Lymphs Abs: 2.4 10*3/uL (ref 0.7–4.0)
MCH: 29.4 pg (ref 26.0–34.0)
MCHC: 32.8 g/dL (ref 30.0–36.0)
MCV: 89.7 fL (ref 80.0–100.0)
Monocytes Absolute: 0.5 10*3/uL (ref 0.1–1.0)
Monocytes Relative: 8 %
Neutro Abs: 2.9 10*3/uL (ref 1.7–7.7)
Neutrophils Relative %: 47 %
Platelets: 183 10*3/uL (ref 150–400)
RBC: 4.86 MIL/uL (ref 4.22–5.81)
RDW: 13 % (ref 11.5–15.5)
WBC: 6.1 10*3/uL (ref 4.0–10.5)
nRBC: 0 % (ref 0.0–0.2)

## 2019-05-16 LAB — SEDIMENTATION RATE: Sed Rate: 1 mm/hr (ref 0–16)

## 2019-05-17 ENCOUNTER — Telehealth: Payer: Self-pay | Admitting: Hematology

## 2019-05-17 NOTE — Telephone Encounter (Signed)
Scheduled appt per 7/15 los.  Left a voice message of appt date and time.

## 2019-11-14 NOTE — Progress Notes (Signed)
HEMATOLOGY/ONCOLOGY CLINIC NOTE  Date of Service: 11/15/2019   Patient Care Team: Patient, No Pcp Per as PCP - General (General Practice)  CHIEF COMPLAINTS/PURPOSE OF CONSULTATION:  F/u for classical [name]   Diagnosis Classical [name] nodular sclerosis type with no type b constitutional symptoms. Stage IVA as per PET/CT scan suggestive of bone marrow involvement.  Treatment ABVD x 3 cycles -> AVD X 3 cycles Completed treatment 01/28/2017  HISTORY OF PRESENTING ILLNESS:  Plz see previous note for details.   INTERVAL HISTORY   [name] is here for schedule 4 month follow-up for f/u of his hodgkins lymphoma after completion of ABVD->AVD in March 2018. [name] was on 05/16/2019. The pt reports that he is doing well overall.  The pt reports no new concerns.  He has received his first shot for the COVID-19 vaccination.   Lab results today (11/15/19) of CBC w/diff and CMP is as follows: all values are WNL except for creatinine at 1.29  On review of systems, pt reports no new concerncs and denies fevers, chills, new infections, abdominal pain, back pain, enlarged lymph nodes, leg swelling, skin rashes, itching and any other symptoms.    MEDICAL HISTORY:  1) Hives to pollen  SURGICAL HISTORY: Past Surgical History:  Procedure Laterality Date  . IR GENERIC HISTORICAL  08/02/2016   IR FLUORO GUIDE PORT INSERTION RIGHT 08/02/2016 [name], MD [facility]  . IR GENERIC HISTORICAL  08/02/2016   IR US GUIDE VASC ACCESS RIGHT 08/02/2016 [name], MD [facility]  . IR REMOVAL TUN ACCESS W/ PORT W/O FL MOD SED  03/02/2017  . KNEE ARTHROSCOPY    . LYMPH GLAND EXCISION Left 07/20/2016   Procedure: LEFT CERVICAL LYMPH NODE BIOPSY;  Surgeon: Jackolyn Confer, MD;  Location: [facility];  Service: General;  Laterality: Left;    SOCIAL HISTORY: Social History   Socioeconomic History  . Marital status: Single      Spouse name: Not on file  . Number of children: Not on file  . Years of education: Not on file  . Highest education level: Not on file  Occupational History  . Not on file  Tobacco Use  . Smoking status: Never Smoker  . Smokeless tobacco: Never Used  Substance and Sexual Activity  . Alcohol use: No  . Drug use: No  . Sexual activity: Not on file  Other Topics Concern  . Not on file  Social History Narrative  . Not on file   Social Determinants of Health   Financial Resource Strain:   . Difficulty of Paying Living Expenses: Not on file  Food Insecurity:   . Worried About Charity fundraiser in the Last Year: Not on file  . Ran Out of Food in the Last Year: Not on file  Transportation Needs:   . Lack of Transportation (Medical): Not on file  . Lack of Transportation (Non-Medical): Not on file  Physical Activity:   . Days of Exercise per Week: Not on file  . Minutes of Exercise per Session: Not on file  Stress:   . Feeling of Stress : Not on file  Social Connections:   . Frequency of Communication with Friends and Family: Not on file  . Frequency of Social Gatherings with Friends and Family: Not on file  . Attends Religious Services: Not on file  . Active Member of Clubs or Organizations: Not on file  . Attends Archivist  Meetings: Not on file  . Marital Status: Not on file  Intimate Partner Violence:   . Fear of Current or Ex-Partner: Not on file  . Emotionally Abused: Not on file  . Physically Abused: Not on file  . Sexually Abused: Not on file  Never smoker   no significant alcohol use  FAMILY HISTORY: family history of hypertension  No known family history of blood disorders or cancer .  ALLERGIES:  has No Known Allergies.  Allergic to pollen no other known drug or food allergies .  MEDICATIONS:  Current Outpatient Medications  Medication Sig Dispense Refill  . ibuprofen (ADVIL,MOTRIN) 200 MG tablet Take 200 mg by mouth every 6 (six) hours as  needed for mild pain.     No current facility-administered medications for this visit.    REVIEW OF SYSTEMS:   A 10+ POINT REVIEW OF SYSTEMS WAS OBTAINED including neurology, dermatology, psychiatry, cardiac, respiratory, lymph, extremities, GI, GU, Musculoskeletal, constitutional, breasts, reproductive, HEENT.  All pertinent positives are noted in the HPI.  All others are negative.    PHYSICAL EXAMINATION:  ECOG FS:0 - Asymptomatic  Vitals:   11/15/19 1446  BP: 125/67  Pulse: (!) 50  Resp: 18  Temp: 97.7 F (36.5 C)  SpO2: 99%   Wt Readings from Last 3 Encounters:  11/15/19 282 lb 6.4 oz (128.1 kg)  05/16/19 266 lb 12.8 oz (121 kg)  11/13/18 271 lb 11.2 oz (123.2 kg)   Body mass index is 37.26 kg/m.    GENERAL:alert, in no acute distress and comfortable SKIN: no acute rashes, no significant lesions EYES: conjunctiva are pink and non-injected, sclera anicteric OROPHARYNX: MMM, no exudates, no oropharyngeal erythema or ulceration NECK: supple, no JVD LYMPH:  no palpable lymphadenopathy in the cervical, axillary or inguinal regions LUNGS: clear to auscultation b/l with normal respiratory effort HEART: regular rate & rhythm ABDOMEN:  normoactive bowel sounds , non tender, not distended. Extremity: no pedal edema PSYCH: alert & oriented x 3 with fluent speech NEURO: no focal motor/sensory deficits     LABORATORY DATA:  I have reviewed the data as listed  CBC Latest Ref Rng & Units 11/15/2019 05/16/2019 11/13/2018  WBC 4.0 - 10.5 K/uL 6.3 6.1 6.0  Hemoglobin 13.0 - 17.0 g/dL 15.3 14.3 13.8  Hematocrit 39.0 - 52.0 % 44.9 43.6 42.2  Platelets 150 - 400 K/uL 193 183 165   . CBC    Component Value Date/Time   WBC 6.3 11/15/2019 1343   RBC 5.19 11/15/2019 1343   HGB 15.3 11/15/2019 1343   HGB 14.2 03/13/2018 1003   HGB 14.1 08/18/2017 0840   HCT 44.9 11/15/2019 1343   HCT 43.2 08/18/2017 0840   PLT 193 11/15/2019 1343   PLT 179 03/13/2018 1003   PLT 199 08/18/2017  0840   MCV 86.5 11/15/2019 1343   MCV 87.3 08/18/2017 0840   MCH 29.5 11/15/2019 1343   MCHC 34.1 11/15/2019 1343   RDW 12.4 11/15/2019 1343   RDW 14.6 08/18/2017 0840   LYMPHSABS 2.2 11/15/2019 1343   LYMPHSABS 2.3 08/18/2017 0840   MONOABS 0.6 11/15/2019 1343   MONOABS 0.6 08/18/2017 0840   EOSABS 0.3 11/15/2019 1343   EOSABS 0.3 08/18/2017 0840   BASOSABS 0.0 11/15/2019 1343   BASOSABS 0.0 08/18/2017 0840    . CMP Latest Ref Rng & Units 11/15/2019 05/16/2019 11/13/2018  Glucose 70 - 99 mg/dL 94 108(H) 85  BUN 6 - 20 mg/dL 19 17 19   Creatinine 0.61 - 1.24  mg/dL 1.29(H) 1.19 1.24  Sodium 135 - 145 mmol/L 141 139 140  Potassium 3.5 - 5.1 mmol/L 3.9 4.1 4.4  Chloride 98 - 111 mmol/L 105 106 107  CO2 22 - 32 mmol/L 27 24 26   Calcium 8.9 - 10.3 mg/dL 9.0 8.8(L) 9.0  Total Protein 6.5 - 8.1 g/dL 7.8 7.3 7.0  Total Bilirubin 0.3 - 1.2 mg/dL 0.5 0.5 0.4  Alkaline Phos 38 - 126 U/L 60 61 63  AST 15 - 41 U/L 18 21 16   ALT 0 - 44 U/L 15 23 19    Sed rate 1    RADIOGRAPHIC STUDIES: I have personally reviewed the radiological images as listed and agreed with the findings in the report.  No results found.  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 Stage IVA as per PET/CT scan suggestive of bone marrow involvement. Sedimentation rate 43 (on diagnosis) - Now normalized with treatment  PET/CT post treatment completion on 02/16/2017 shows CR status with no residual disease > Deauville 1.  #2 Elevated transaminases after C1D1 treatment - now normalized  and has remained normal on subsequent checks.    Plan      -Discussed pt labwork today, 11/15/19; all values are WNL except for creatinine at 1.29. -Discussed that blood chemistries and blood counts are stable  -Discussed options for PCP.  -Discussed taking pneumonia vaccines -- will plan to  give Prevnar with next visit. -no clinical or lab evidence of lymphoma recurrence at this time.  FOLLOW UP: RTC with Dr Irene Limbo with labs in 6 months  The total time spent in the appt was 20 minutes and more than 50% was on counseling and direct patient cares.  All of the patient's questions were answered with apparent satisfaction. The patient knows to call the clinic with any problems, questions or concerns.    Sullivan Lone MD MS AAHIVMS [facility] Hematology/Oncology Physician [facility]  (Office):       [phone] (Work cell):  [phone] (Fax):           [phone]  I, Scot Dock, am acting as a scribe for Dr. Sullivan Lone.   .I have reviewed the above documentation for accuracy and completeness, and I agree with the above. Brunetta Genera MD

## 2019-11-15 ENCOUNTER — Telehealth: Payer: Self-pay | Admitting: Hematology

## 2019-11-15 ENCOUNTER — Other Ambulatory Visit: Payer: Self-pay

## 2019-11-15 ENCOUNTER — Inpatient Hospital Stay (HOSPITAL_BASED_OUTPATIENT_CLINIC_OR_DEPARTMENT_OTHER): Payer: 59 | Admitting: Hematology

## 2019-11-15 ENCOUNTER — Inpatient Hospital Stay: Payer: 59 | Attending: Hematology

## 2019-11-15 VITALS — BP 125/67 | HR 50 | Temp 97.7°F | Resp 18 | Ht 73.0 in | Wt 282.4 lb

## 2019-11-15 DIAGNOSIS — Z8571 Personal history of Hodgkin lymphoma: Secondary | ICD-10-CM | POA: Diagnosis not present

## 2019-11-15 DIAGNOSIS — C8111 Nodular sclerosis classical Hodgkin lymphoma, lymph nodes of head, face, and neck: Secondary | ICD-10-CM | POA: Diagnosis not present

## 2019-11-15 LAB — CMP (CANCER CENTER ONLY)
ALT: 15 U/L (ref 0–44)
AST: 18 U/L (ref 15–41)
Albumin: 4.4 g/dL (ref 3.5–5.0)
Alkaline Phosphatase: 60 U/L (ref 38–126)
Anion gap: 9 (ref 5–15)
BUN: 19 mg/dL (ref 6–20)
CO2: 27 mmol/L (ref 22–32)
Calcium: 9 mg/dL (ref 8.9–10.3)
Chloride: 105 mmol/L (ref 98–111)
Creatinine: 1.29 mg/dL — ABNORMAL HIGH (ref 0.61–1.24)
GFR, Est AFR Am: >60 mL/min (ref >60)
GFR, Estimated: >60 mL/min (ref >60)
Glucose, Bld: 94 mg/dL (ref 70–99)
Potassium: 3.9 mmol/L (ref 3.5–5.1)
Sodium: 141 mmol/L (ref 135–145)
Total Bilirubin: 0.5 mg/dL (ref 0.3–1.2)
Total Protein: 7.8 g/dL (ref 6.5–8.1)

## 2019-11-15 LAB — CBC WITH DIFFERENTIAL/PLATELET
Abs Immature Granulocytes: 0 10*3/uL (ref 0.00–0.07)
Basophils Absolute: 0 10*3/uL (ref 0.0–0.1)
Basophils Relative: 1 %
Eosinophils Absolute: 0.3 10*3/uL (ref 0.0–0.5)
Eosinophils Relative: 5 %
HCT: 44.9 % (ref 39.0–52.0)
Hemoglobin: 15.3 g/dL (ref 13.0–17.0)
Immature Granulocytes: 0 %
Lymphocytes Relative: 35 %
Lymphs Abs: 2.2 10*3/uL (ref 0.7–4.0)
MCH: 29.5 pg (ref 26.0–34.0)
MCHC: 34.1 g/dL (ref 30.0–36.0)
MCV: 86.5 fL (ref 80.0–100.0)
Monocytes Absolute: 0.6 10*3/uL (ref 0.1–1.0)
Monocytes Relative: 10 %
Neutro Abs: 3.1 10*3/uL (ref 1.7–7.7)
Neutrophils Relative %: 49 %
Platelets: 193 10*3/uL (ref 150–400)
RBC: 5.19 MIL/uL (ref 4.22–5.81)
RDW: 12.4 % (ref 11.5–15.5)
WBC: 6.3 10*3/uL (ref 4.0–10.5)
nRBC: 0 % (ref 0.0–0.2)

## 2019-11-15 LAB — SEDIMENTATION RATE: Sed Rate: 1 mm/hr (ref 0–16)

## 2019-11-15 NOTE — Telephone Encounter (Signed)
Scheduled appt per 1/14 los.  Sent a message to HIM pool to get a calendar mailed out. 

## 2019-11-16 ENCOUNTER — Ambulatory Visit: Payer: 59 | Admitting: Hematology

## 2019-11-16 ENCOUNTER — Other Ambulatory Visit: Payer: 59

## 2020-05-14 ENCOUNTER — Inpatient Hospital Stay: Payer: 59

## 2020-05-14 ENCOUNTER — Inpatient Hospital Stay: Payer: 59 | Admitting: Hematology

## 2020-05-21 ENCOUNTER — Inpatient Hospital Stay: Payer: 59

## 2020-05-21 ENCOUNTER — Inpatient Hospital Stay: Payer: 59 | Admitting: Hematology

## 2020-05-21 ENCOUNTER — Other Ambulatory Visit: Payer: Self-pay

## 2020-05-21 ENCOUNTER — Inpatient Hospital Stay: Payer: 59 | Attending: Hematology

## 2020-05-21 VITALS — BP 110/70 | HR 53 | Temp 97.9°F | Resp 18 | Ht 73.0 in | Wt 279.2 lb

## 2020-05-21 DIAGNOSIS — C8111 Nodular sclerosis classical Hodgkin lymphoma, lymph nodes of head, face, and neck: Secondary | ICD-10-CM

## 2020-05-21 DIAGNOSIS — Z8571 Personal history of Hodgkin lymphoma: Secondary | ICD-10-CM | POA: Insufficient documentation

## 2020-05-21 DIAGNOSIS — R7989 Other specified abnormal findings of blood chemistry: Secondary | ICD-10-CM

## 2020-05-21 LAB — CBC WITH DIFFERENTIAL/PLATELET
Abs Immature Granulocytes: 0.01 10*3/uL (ref 0.00–0.07)
Basophils Absolute: 0 10*3/uL (ref 0.0–0.1)
Basophils Relative: 0 %
Eosinophils Absolute: 0.2 10*3/uL (ref 0.0–0.5)
Eosinophils Relative: 4 %
HCT: 44 % (ref 39.0–52.0)
Hemoglobin: 14.5 g/dL (ref 13.0–17.0)
Immature Granulocytes: 0 %
Lymphocytes Relative: 37 %
Lymphs Abs: 2.1 10*3/uL (ref 0.7–4.0)
MCH: 29.1 pg (ref 26.0–34.0)
MCHC: 33 g/dL (ref 30.0–36.0)
MCV: 88.4 fL (ref 80.0–100.0)
Monocytes Absolute: 0.4 10*3/uL (ref 0.1–1.0)
Monocytes Relative: 7 %
Neutro Abs: 2.9 10*3/uL (ref 1.7–7.7)
Neutrophils Relative %: 52 %
Platelets: 191 10*3/uL (ref 150–400)
RBC: 4.98 MIL/uL (ref 4.22–5.81)
RDW: 12.9 % (ref 11.5–15.5)
WBC: 5.7 10*3/uL (ref 4.0–10.5)
nRBC: 0 % (ref 0.0–0.2)

## 2020-05-21 LAB — URINALYSIS, COMPLETE (UACMP) WITH MICROSCOPIC
Bacteria, UA: NONE SEEN
Bilirubin Urine: NEGATIVE
Glucose, UA: NEGATIVE mg/dL
Hgb urine dipstick: NEGATIVE
Ketones, ur: NEGATIVE mg/dL
Leukocytes,Ua: NEGATIVE
Nitrite: NEGATIVE
Protein, ur: 100 mg/dL — AB
Specific Gravity, Urine: 1.025 (ref 1.005–1.030)
pH: 8 (ref 5.0–8.0)

## 2020-05-21 LAB — CMP (CANCER CENTER ONLY)
ALT: 22 U/L (ref 0–44)
AST: 20 U/L (ref 15–41)
Albumin: 4.3 g/dL (ref 3.5–5.0)
Alkaline Phosphatase: 62 U/L (ref 38–126)
Anion gap: 8 (ref 5–15)
BUN: 14 mg/dL (ref 6–20)
CO2: 25 mmol/L (ref 22–32)
Calcium: 9.6 mg/dL (ref 8.9–10.3)
Chloride: 107 mmol/L (ref 98–111)
Creatinine: 1.45 mg/dL — ABNORMAL HIGH (ref 0.61–1.24)
GFR, Est AFR Am: >60 mL/min (ref >60)
GFR, Estimated: >60 mL/min (ref >60)
Glucose, Bld: 88 mg/dL (ref 70–99)
Potassium: 4.1 mmol/L (ref 3.5–5.1)
Sodium: 140 mmol/L (ref 135–145)
Total Bilirubin: 0.5 mg/dL (ref 0.3–1.2)
Total Protein: 7.7 g/dL (ref 6.5–8.1)

## 2020-05-21 LAB — SEDIMENTATION RATE: Sed Rate: 1 mm/hr (ref 0–16)

## 2020-05-21 NOTE — Progress Notes (Signed)
HEMATOLOGY/ONCOLOGY CLINIC NOTE  Date of Service: 05/21/2020   Patient Care Team: Patient, No Pcp Per as PCP - General (General Practice)  CHIEF COMPLAINTS/PURPOSE OF CONSULTATION:  F/u for classical [name]   Diagnosis Classical [name] nodular sclerosis type with no type b constitutional symptoms. Stage IVA as per PET/CT scan suggestive of bone marrow involvement.  Treatment ABVD x 3 cycles -> AVD X 3 cycles Completed treatment 01/28/2017  HISTORY OF PRESENTING ILLNESS:  Plz see previous note for details.   INTERVAL HISTORY  Craig Schmitt is here for scheduled 6 month f/u for his hodgkins lymphoma after completion of ABVD->AVD in March 2018. [name] was on 11/15/2019. The pt reports that he is doing well overall.  The pt reports that he has felt overall and has no new constitutional symptoms. He is experiencing some stiffness in his knee, but feels that it is due to improper stretching before activity. He has been using high-protein Muscle Milk, but no other OTC supplements. Pt has no history of kidney stones and denies using any OTC pain medication recently. Pt is currently following with Dr. Merrilee Seashore for primary care.   Lab results today (05/21/20) of CBC w/diff and CMP is as follows: all values are WNL except for Creatinine at 1.45.  05/21/2020 Sed rate at 1  On review of systems, pt reports knee stiffness and denies new lumps/bumps, fevers, chills, rashes, unexpected weight loss, abdominal pain, bowel habit changes, dysuria, hematuria, testicular pain/swelling and any other symptoms.     MEDICAL HISTORY:  1) Hives to pollen  SURGICAL HISTORY: Past Surgical History:  Procedure Laterality Date  . IR GENERIC HISTORICAL  08/02/2016   IR FLUORO GUIDE PORT INSERTION RIGHT 08/02/2016 [name], MD [facility]  . IR GENERIC HISTORICAL  08/02/2016   IR US GUIDE VASC ACCESS RIGHT 08/02/2016 [name], MD [facility]  . IR REMOVAL TUN ACCESS W/ PORT W/O FL MOD SED  03/02/2017  . KNEE ARTHROSCOPY    . LYMPH GLAND EXCISION Left 07/20/2016   Procedure: LEFT CERVICAL LYMPH NODE BIOPSY;  Surgeon: Jackolyn Confer, MD;  Location: [facility];  Service: General;  Laterality: Left;    SOCIAL HISTORY: Social History   Socioeconomic History  . Marital status: Single    Spouse name: Not on file  . Number of children: Not on file  . Years of education: Not on file  . Highest education level: Not on file  Occupational History  . Not on file  Tobacco Use  . Smoking status: Never Smoker  . Smokeless tobacco: Never Used  Vaping Use  . Vaping Use: Never used  Substance and Sexual Activity  . Alcohol use: No  . Drug use: No  . Sexual activity: Not on file  Other Topics Concern  . Not on file  Social History Narrative  . Not on file   Social Determinants of Health   Financial Resource Strain:   . Difficulty of Paying Living Expenses:   Food Insecurity:   . Worried About Charity fundraiser in the Last Year:   . Arboriculturist in the Last Year:   Transportation Needs:   . Film/video editor (Medical):   [name] Lack of Transportation (Non-Medical):   Physical Activity:   . Days of Exercise per Week:   . Minutes of Exercise per Session:   Stress:   . Feeling of Stress :   Social Connections:   .  Frequency of Communication with Friends and Family:   . Frequency of Social Gatherings with Friends and Family:   . Attends Religious Services:   . Active Member of Clubs or Organizations:   . Attends Archivist Meetings:   [name] Marital Status:   Intimate Partner Violence:   . Fear of Current or Ex-Partner:   . Emotionally Abused:   [name] Physically Abused:   . Sexually Abused:   Never smoker   no significant alcohol use  FAMILY HISTORY: family history of hypertension  No known family history of blood disorders or cancer .  ALLERGIES:  has No Known Allergies.  Allergic to  pollen no other known drug or food allergies .  MEDICATIONS:  Current Outpatient Medications  Medication Sig Dispense Refill  . ibuprofen (ADVIL,MOTRIN) 200 MG tablet Take 200 mg by mouth every 6 (six) hours as needed for mild pain.     No current facility-administered medications for this visit.    REVIEW OF SYSTEMS:   A 10+ POINT REVIEW OF SYSTEMS WAS OBTAINED including neurology, dermatology, psychiatry, cardiac, respiratory, lymph, extremities, GI, GU, Musculoskeletal, constitutional, breasts, reproductive, HEENT.  All pertinent positives are noted in the HPI.  All others are negative.   PHYSICAL EXAMINATION:  ECOG FS:0 - Asymptomatic  Vitals:   05/21/20 1328  BP: 110/70  Pulse: (!) 53  Resp: 18  Temp: 97.9 F (36.6 C)  SpO2: 99%   Wt Readings from Last 3 Encounters:  05/21/20 279 lb 3.2 oz (126.6 kg)  11/15/19 282 lb 6.4 oz (128.1 kg)  05/16/19 266 lb 12.8 oz (121 kg)   Body mass index is 36.84 kg/m.    GENERAL:alert, in no acute distress and comfortable SKIN: no acute rashes, no significant lesions EYES: conjunctiva are pink and non-injected, sclera anicteric OROPHARYNX: MMM, no exudates, no oropharyngeal erythema or ulceration NECK: supple, no JVD LYMPH:  no palpable lymphadenopathy in the cervical, axillary or inguinal regions LUNGS: clear to auscultation b/l with normal respiratory effort HEART: regular rate & rhythm ABDOMEN:  normoactive bowel sounds , non tender, not distended. No palpable hepatosplenomegaly.  Extremity: no pedal edema PSYCH: alert & oriented x 3 with fluent speech NEURO: no focal motor/sensory deficits  LABORATORY DATA:  I have reviewed the data as listed  CBC Latest Ref Rng & Units 05/21/2020 11/15/2019 05/16/2019  WBC 4.0 - 10.5 K/uL 5.7 6.3 6.1  Hemoglobin 13.0 - 17.0 g/dL 14.5 15.3 14.3  Hematocrit 39 - 52 % 44.0 44.9 43.6  Platelets 150 - 400 K/uL 191 193 183   . CBC    Component Value Date/Time   WBC 5.7 05/21/2020 1305   RBC  4.98 05/21/2020 1305   HGB 14.5 05/21/2020 1305   HGB 14.2 03/13/2018 1003   HGB 14.1 08/18/2017 0840   HCT 44.0 05/21/2020 1305   HCT 43.2 08/18/2017 0840   PLT 191 05/21/2020 1305   PLT 179 03/13/2018 1003   PLT 199 08/18/2017 0840   MCV 88.4 05/21/2020 1305   MCV 87.3 08/18/2017 0840   MCH 29.1 05/21/2020 1305   MCHC 33.0 05/21/2020 1305   RDW 12.9 05/21/2020 1305   RDW 14.6 08/18/2017 0840   LYMPHSABS 2.1 05/21/2020 1305   LYMPHSABS 2.3 08/18/2017 0840   MONOABS 0.4 05/21/2020 1305   MONOABS 0.6 08/18/2017 0840   EOSABS 0.2 05/21/2020 1305   EOSABS 0.3 08/18/2017 0840   BASOSABS 0.0 05/21/2020 1305   BASOSABS 0.0 08/18/2017 0840    . CMP Latest Ref Rng &  Units 05/21/2020 11/15/2019 05/16/2019  Glucose 70 - 99 mg/dL 88 94 108(H)  BUN 6 - 20 mg/dL 14 19 17   Creatinine 0.61 - 1.24 mg/dL 1.45(H) 1.29(H) 1.19  Sodium 135 - 145 mmol/L 140 141 139  Potassium 3.5 - 5.1 mmol/L 4.1 3.9 4.1  Chloride 98 - 111 mmol/L 107 105 106  CO2 22 - 32 mmol/L 25 27 24   Calcium 8.9 - 10.3 mg/dL 9.6 9.0 8.8(L)  Total Protein 6.5 - 8.1 g/dL 7.7 7.8 7.3  Total Bilirubin 0.3 - 1.2 mg/dL 0.5 0.5 0.5  Alkaline Phos 38 - 126 U/L 62 60 61  AST 15 - 41 U/L 20 18 21   ALT 0 - 44 U/L 22 15 23    Sed rate 1    RADIOGRAPHIC STUDIES: I have personally reviewed the radiological images as listed and agreed with the findings in the report.  No results found.  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 Stage IVA as per PET/CT scan suggestive of bone marrow involvement. Sedimentation rate 43 (on diagnosis) - Now normalized with treatment  PET/CT post treatment completion on 02/16/2017 shows CR status with no residual disease > Deauville 1.  #2 Elevated transaminases after C1D1 treatment - now normalized  and has remained normal on subsequent checks.     PLAN: -Discussed pt labwork today, 05/21/20; blood counts are nml, Creatinine is elevated, other blood chemistries are good, Sed rate is WNL -No lab or clinical evidence of [name] recurrence at this time.  -Pt is currently over three years out from treatment. -Advised pt that elevated Creatinine could be caused by increased muscle mass, certain supplements, or excessive OTC pain medication.  -Recommended that the pt continue to eat well, drink at least 48-64 oz of water each day, and walk 20-30 minutes each day.  -Will get urine test today  -Will see back in 6 months with labs    FOLLOW UP: Return to lab today for urine test RTC with Dr Irene Limbo with labs in 6 months    The total time spent in the appt was 20 minutes and more than 50% was on counseling and direct patient cares.  All of the patient's questions were answered with apparent satisfaction. The patient knows to call the clinic with any problems, questions or concerns.  Craig Lone MD MS AAHIVMS [facility]   ADDENDUM  Hematology/Oncology Physician [place]  (Office):       [phone] (Work cell):  [phone] (Fax):           [phone]  I, Yevette Edwards, am acting as a scribe for Dr. Sullivan Schmitt.   .I have reviewed the above documentation for accuracy and completeness, and I agree with the above. Brunetta Genera MD     Addendum  Component     Latest Ref Rng & Units 05/21/2020  Color, Urine     YELLOW YELLOW  Appearance     CLEAR CLEAR  Specific Gravity, Urine     1.005 - 1.030 1.025  pH     5.0 - 8.0 8.0  Glucose, UA     NEGATIVE mg/dL NEGATIVE  Hgb urine dipstick     NEGATIVE NEGATIVE  Bilirubin Urine     NEGATIVE NEGATIVE  Ketones, ur     NEGATIVE mg/dL NEGATIVE  Protein     NEGATIVE mg/dL 100 (A)  Nitrite     NEGATIVE NEGATIVE  Leukocytes,Ua     NEGATIVE NEGATIVE  RBC / HPF     0 - 5 RBC/hpf 0-5  WBC, UA     0 - 5 WBC/hpf 0-5  Bacteria, UA      NONE SEEN NONE SEEN  Squamous Epithelial / LPF     0 - 5 0-5  Mucus      PRESENT  Hyaline Casts, UA      PRESENT  Creatinine, Urine     mg/dL 351.46  Total Protein, Urine     mg/dL 26  Protein Creatinine Ratio     0.00 - 0.15 mg/mgCre 0.07    Bland UA, normal TP/creatinineratio

## 2020-05-22 LAB — PROTEIN / CREATININE RATIO, URINE
Creatinine, Urine: 351.46 mg/dL
Protein Creatinine Ratio: 0.07 mg/mg{Cre} (ref 0.00–0.15)
Total Protein, Urine: 26 mg/dL

## 2020-11-17 ENCOUNTER — Telehealth: Payer: Self-pay | Admitting: Hematology

## 2020-11-17 NOTE — Telephone Encounter (Signed)
Rescheduled 01/18 appointment to 02/08 due to weather conditions and per provider orders, patient has been called and voicemail was left.

## 2020-11-18 ENCOUNTER — Inpatient Hospital Stay: Payer: 59 | Admitting: Hematology

## 2020-11-18 ENCOUNTER — Inpatient Hospital Stay: Payer: 59

## 2020-12-08 NOTE — Progress Notes (Signed)
HEMATOLOGY/ONCOLOGY CLINIC NOTE  Date of Service: 12/08/2020   Patient Care Team: Patient, No Pcp Per as PCP - General (General Practice)  CHIEF COMPLAINTS/PURPOSE OF CONSULTATION:  F/u for classical [name]   Diagnosis Classical [name] nodular sclerosis type with no type b constitutional symptoms. Stage IVA as per PET/CT scan suggestive of bone marrow involvement.  Treatment ABVD x 3 cycles -> AVD X 3 cycles Completed treatment 01/28/2017  HISTORY OF PRESENTING ILLNESS:  Plz see previous note for details.   INTERVAL HISTORY  Craig Schmitt is here for scheduled 6 month f/u for his hodgkins lymphoma after completion of ABVD->AVD in March 2018. [name] was on 05/21/2020. The pt reports that he is doing well overall.  The pt reports no new symptoms or concerns. He is not currently on any medications.  Lab results today 12/09/2020 of CBC w/diff and CMP is as follows: all values are WNL except for Anion gap of 4. 12/09/2020 Sedimentation rate is 5  On review of systems, pt denies new lumps/bumps, fevers, chills, night sweats, fatigue, abdominal pain, back pain, leg swelling and any other symptoms.   MEDICAL HISTORY:  1) Hives to pollen  SURGICAL HISTORY: Past Surgical History:  Procedure Laterality Date  . IR GENERIC HISTORICAL  08/02/2016   IR FLUORO GUIDE PORT INSERTION RIGHT 08/02/2016 Craig Edouard, MD [facility]  . IR GENERIC HISTORICAL  08/02/2016   IR US GUIDE VASC ACCESS RIGHT 08/02/2016 Craig Edouard, MD [facility]  . IR REMOVAL TUN ACCESS W/ PORT W/O FL MOD SED  03/02/2017  . KNEE ARTHROSCOPY    . LYMPH GLAND EXCISION Left 07/20/2016   Procedure: LEFT CERVICAL LYMPH NODE BIOPSY;  Surgeon: Craig Confer, MD;  Location: [place];  Service: General;  Laterality: Left;    SOCIAL HISTORY: Social History   Socioeconomic History  . Marital status: Single    Spouse name: Not on file  . Number of  children: Not on file  . Years of education: Not on file  . Highest education level: Not on file  Occupational History  . Not on file  Tobacco Use  . Smoking status: Never Smoker  . Smokeless tobacco: Never Used  Vaping Use  . Vaping Use: Never used  Substance and Sexual Activity  . Alcohol use: No  . Drug use: No  . Sexual activity: Not on file  Other Topics Concern  . Not on file  Social History Narrative  . Not on file   Social Determinants of Health   Financial Resource Strain: Not on file  Food Insecurity: Not on file  Transportation Needs: Not on file  Physical Activity: Not on file  Stress: Not on file  Social Connections: Not on file  Intimate Partner Violence: Not on file  Never smoker   no significant alcohol use  FAMILY HISTORY: family history of hypertension  No known family history of blood disorders or cancer .  ALLERGIES:  has No Known Allergies.  Allergic to pollen no other known drug or food allergies .  MEDICATIONS:  Current Outpatient Medications  Medication Sig Dispense Refill  . ibuprofen (ADVIL,MOTRIN) 200 MG tablet Take 200 mg by mouth every 6 (six) hours as needed for mild pain.     No current facility-administered medications for this visit.    REVIEW OF SYSTEMS:   10 Point review of Systems was done is negative except as noted above.  PHYSICAL EXAMINATION:  ECOG FS:0 -  Asymptomatic  Vitals:   12/09/20 1420  BP: 125/67  Pulse: (!) 53  Resp: 20  Temp: 97.7 F (36.5 C)  SpO2: 100%   Wt Readings from Last 3 Encounters:  05/21/20 279 lb 3.2 oz (126.6 kg)  11/15/19 282 lb 6.4 oz (128.1 kg)  05/16/19 266 lb 12.8 oz (121 kg)   Body mass index is 38.05 kg/m.     GENERAL:alert, in no acute distress and comfortable SKIN: no acute rashes, no significant lesions EYES: conjunctiva are pink and non-injected, sclera anicteric OROPHARYNX: MMM, no exudates, no oropharyngeal erythema or ulceration NECK: supple, no JVD LYMPH:  no  palpable lymphadenopathy in the cervical, axillary or inguinal regions LUNGS: clear to auscultation b/l with normal respiratory effort HEART: regular rate & rhythm ABDOMEN:  normoactive bowel sounds , non tender, not distended. Extremity: no pedal edema PSYCH: alert & oriented x 3 with fluent speech NEURO: no focal motor/sensory deficits  LABORATORY DATA:  I have reviewed the data as listed  CBC Latest Ref Rng & Units 05/21/2020 11/15/2019 05/16/2019  WBC 4.0 - 10.5 K/uL 5.7 6.3 6.1  Hemoglobin 13.0 - 17.0 g/dL 14.5 15.3 14.3  Hematocrit 39.0 - 52.0 % 44.0 44.9 43.6  Platelets 150 - 400 K/uL 191 193 183   CBC    Component Value Date/Time   WBC 5.7 05/21/2020 1305   RBC 4.98 05/21/2020 1305   HGB 14.5 05/21/2020 1305   HGB 14.2 03/13/2018 1003   HGB 14.1 08/18/2017 0840   HCT 44.0 05/21/2020 1305   HCT 43.2 08/18/2017 0840   PLT 191 05/21/2020 1305   PLT 179 03/13/2018 1003   PLT 199 08/18/2017 0840   MCV 88.4 05/21/2020 1305   MCV 87.3 08/18/2017 0840   MCH 29.1 05/21/2020 1305   MCHC 33.0 05/21/2020 1305   RDW 12.9 05/21/2020 1305   RDW 14.6 08/18/2017 0840   LYMPHSABS 2.1 05/21/2020 1305   LYMPHSABS 2.3 08/18/2017 0840   MONOABS 0.4 05/21/2020 1305   MONOABS 0.6 08/18/2017 0840   EOSABS 0.2 05/21/2020 1305   EOSABS 0.3 08/18/2017 0840   BASOSABS 0.0 05/21/2020 1305   BASOSABS 0.0 08/18/2017 0840    . CMP Latest Ref Rng & Units 05/21/2020 11/15/2019 05/16/2019  Glucose 70 - 99 mg/dL 88 94 108(H)  BUN 6 - 20 mg/dL 14 19 17   Creatinine 0.61 - 1.24 mg/dL 1.45(H) 1.29(H) 1.19  Sodium 135 - 145 mmol/L 140 141 139  Potassium 3.5 - 5.1 mmol/L 4.1 3.9 4.1  Chloride 98 - 111 mmol/L 107 105 106  CO2 22 - 32 mmol/L 25 27 24   Calcium 8.9 - 10.3 mg/dL 9.6 9.0 8.8(L)  Total Protein 6.5 - 8.1 g/dL 7.7 7.8 7.3  Total Bilirubin 0.3 - 1.2 mg/dL 0.5 0.5 0.5  Alkaline Phos 38 - 126 U/L 62 60 61  AST 15 - 41 U/L 20 18 21   ALT 0 - 44 U/L 22 15 23    Sed rate 1    RADIOGRAPHIC  STUDIES: I have personally reviewed the radiological images as listed and agreed with the findings in the report.  No results found.  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 Stage IVA as per PET/CT scan suggestive of bone marrow involvement. Sedimentation rate 43 (on diagnosis) - Now normalized with treatment  PET/CT post treatment completion on 02/16/2017 shows CR status with no residual  disease > Deauville 1.  #2 Elevated transaminases after C1D1 treatment - now normalized  and has remained normal on subsequent checks.    PLAN: -Discussed pt labwork today, 12/09/2020; blood counts and chemistries normal. -Advised pt he is nearly 4 years out of treatment. Will switch to annual visits following 5 year mark. -Advised pt that risk of recurrent at this point is very close to the average risk of someone his age. -No lab or clinical evidence of [name] recurrence at this time.  -Recommended that the pt continue to eat well, drink at least 48-64 oz of water each day, and walk 20-30 minutes each day.  -Will see back in 6 months with labs.   FOLLOW UP: RTC with Craig Schmitt with labs in 6 months   The total time spent in the appointment was 20 minutes and more than 50% was on counseling and direct patient cares.  All of the patient's questions were answered with apparent satisfaction. The patient knows to call the clinic with any problems, questions or concerns.  Craig Lone MD [place] AAHIVMS [facility]  Hematology/Oncology Physician [facility]  (Office):       [phone] (Work cell):  [phone] (Fax):           [phone]  I, Craig Schmitt, am acting as scribe for Craig. Sullivan Lone, MD.   .I have reviewed the above documentation for accuracy and completeness, and I agree with the above. Craig Genera MD

## 2020-12-09 ENCOUNTER — Other Ambulatory Visit: Payer: Self-pay

## 2020-12-09 ENCOUNTER — Inpatient Hospital Stay: Payer: 59 | Admitting: Hematology

## 2020-12-09 ENCOUNTER — Inpatient Hospital Stay: Payer: 59 | Attending: Hematology

## 2020-12-09 ENCOUNTER — Telehealth: Payer: Self-pay | Admitting: Hematology

## 2020-12-09 VITALS — BP 125/67 | HR 53 | Temp 97.7°F | Resp 20 | Ht 73.0 in | Wt 288.4 lb

## 2020-12-09 DIAGNOSIS — C8111 Nodular sclerosis classical Hodgkin lymphoma, lymph nodes of head, face, and neck: Secondary | ICD-10-CM

## 2020-12-09 DIAGNOSIS — R7989 Other specified abnormal findings of blood chemistry: Secondary | ICD-10-CM

## 2020-12-09 DIAGNOSIS — R7401 Elevation of levels of liver transaminase levels: Secondary | ICD-10-CM | POA: Diagnosis not present

## 2020-12-09 DIAGNOSIS — Z8571 Personal history of Hodgkin lymphoma: Secondary | ICD-10-CM | POA: Insufficient documentation

## 2020-12-09 LAB — CBC WITH DIFFERENTIAL/PLATELET
Abs Immature Granulocytes: 0 10*3/uL (ref 0.00–0.07)
Basophils Absolute: 0 10*3/uL (ref 0.0–0.1)
Basophils Relative: 1 %
Eosinophils Absolute: 0.3 10*3/uL (ref 0.0–0.5)
Eosinophils Relative: 5 %
HCT: 43.8 % (ref 39.0–52.0)
Hemoglobin: 14.3 g/dL (ref 13.0–17.0)
Immature Granulocytes: 0 %
Lymphocytes Relative: 38 %
Lymphs Abs: 2.1 10*3/uL (ref 0.7–4.0)
MCH: 29 pg (ref 26.0–34.0)
MCHC: 32.6 g/dL (ref 30.0–36.0)
MCV: 88.8 fL (ref 80.0–100.0)
Monocytes Absolute: 0.5 10*3/uL (ref 0.1–1.0)
Monocytes Relative: 9 %
Neutro Abs: 2.6 10*3/uL (ref 1.7–7.7)
Neutrophils Relative %: 47 %
Platelets: 194 10*3/uL (ref 150–400)
RBC: 4.93 MIL/uL (ref 4.22–5.81)
RDW: 13.2 % (ref 11.5–15.5)
WBC: 5.5 10*3/uL (ref 4.0–10.5)
nRBC: 0 % (ref 0.0–0.2)

## 2020-12-09 LAB — CMP (CANCER CENTER ONLY)
ALT: 23 U/L (ref 0–44)
AST: 21 U/L (ref 15–41)
Albumin: 4.2 g/dL (ref 3.5–5.0)
Alkaline Phosphatase: 63 U/L (ref 38–126)
Anion gap: 4 — ABNORMAL LOW (ref 5–15)
BUN: 14 mg/dL (ref 6–20)
CO2: 27 mmol/L (ref 22–32)
Calcium: 9.1 mg/dL (ref 8.9–10.3)
Chloride: 106 mmol/L (ref 98–111)
Creatinine: 1.24 mg/dL (ref 0.61–1.24)
GFR, Estimated: >60 mL/min (ref >60)
Glucose, Bld: 86 mg/dL (ref 70–99)
Potassium: 4.1 mmol/L (ref 3.5–5.1)
Sodium: 137 mmol/L (ref 135–145)
Total Bilirubin: 0.8 mg/dL (ref 0.3–1.2)
Total Protein: 7.6 g/dL (ref 6.5–8.1)

## 2020-12-09 LAB — SEDIMENTATION RATE: Sed Rate: 5 mm/hr (ref 0–16)

## 2020-12-09 NOTE — Telephone Encounter (Signed)
Scheduled appointment per 2/8 los. Spoke to patient who is aware of appointment date and time.

## 2021-06-09 ENCOUNTER — Telehealth: Payer: Self-pay | Admitting: Hematology

## 2021-06-09 NOTE — Telephone Encounter (Signed)
Left message with rescheduled upcoming appointment due to provider's emergency. 

## 2021-06-10 ENCOUNTER — Ambulatory Visit: Payer: 59 | Admitting: Hematology

## 2021-06-10 ENCOUNTER — Other Ambulatory Visit: Payer: 59

## 2021-06-22 ENCOUNTER — Inpatient Hospital Stay: Payer: 59 | Admitting: Hematology

## 2021-06-22 ENCOUNTER — Other Ambulatory Visit: Payer: Self-pay

## 2021-06-22 ENCOUNTER — Inpatient Hospital Stay: Payer: 59 | Attending: Hematology

## 2021-06-22 VITALS — BP 130/78 | HR 43 | Temp 97.6°F | Resp 17 | Wt 283.9 lb

## 2021-06-22 DIAGNOSIS — C8111 Nodular sclerosis classical Hodgkin lymphoma, lymph nodes of head, face, and neck: Secondary | ICD-10-CM

## 2021-06-22 DIAGNOSIS — Z8571 Personal history of Hodgkin lymphoma: Secondary | ICD-10-CM | POA: Insufficient documentation

## 2021-06-22 LAB — CMP (CANCER CENTER ONLY)
ALT: 23 U/L (ref 0–44)
AST: 26 U/L (ref 15–41)
Albumin: 4.2 g/dL (ref 3.5–5.0)
Alkaline Phosphatase: 59 U/L (ref 38–126)
Anion gap: 8 (ref 5–15)
BUN: 12 mg/dL (ref 6–20)
CO2: 27 mmol/L (ref 22–32)
Calcium: 9.3 mg/dL (ref 8.9–10.3)
Chloride: 108 mmol/L (ref 98–111)
Creatinine: 1.26 mg/dL — ABNORMAL HIGH (ref 0.61–1.24)
GFR, Estimated: >60 mL/min (ref >60)
Glucose, Bld: 92 mg/dL (ref 70–99)
Potassium: 4.4 mmol/L (ref 3.5–5.1)
Sodium: 143 mmol/L (ref 135–145)
Total Bilirubin: 0.4 mg/dL (ref 0.3–1.2)
Total Protein: 7.3 g/dL (ref 6.5–8.1)

## 2021-06-22 LAB — CBC WITH DIFFERENTIAL/PLATELET
Abs Immature Granulocytes: 0 10*3/uL (ref 0.00–0.07)
Basophils Absolute: 0 10*3/uL (ref 0.0–0.1)
Basophils Relative: 0 %
Eosinophils Absolute: 0.3 10*3/uL (ref 0.0–0.5)
Eosinophils Relative: 5 %
HCT: 41.3 % (ref 39.0–52.0)
Hemoglobin: 13.6 g/dL (ref 13.0–17.0)
Immature Granulocytes: 0 %
Lymphocytes Relative: 43 %
Lymphs Abs: 2.3 10*3/uL (ref 0.7–4.0)
MCH: 29.4 pg (ref 26.0–34.0)
MCHC: 32.9 g/dL (ref 30.0–36.0)
MCV: 89.4 fL (ref 80.0–100.0)
Monocytes Absolute: 0.4 10*3/uL (ref 0.1–1.0)
Monocytes Relative: 8 %
Neutro Abs: 2.3 10*3/uL (ref 1.7–7.7)
Neutrophils Relative %: 44 %
Platelets: 193 10*3/uL (ref 150–400)
RBC: 4.62 MIL/uL (ref 4.22–5.81)
RDW: 13.2 % (ref 11.5–15.5)
WBC: 5.3 10*3/uL (ref 4.0–10.5)
nRBC: 0 % (ref 0.0–0.2)

## 2021-06-22 LAB — SEDIMENTATION RATE: Sed Rate: 0 mm/hr (ref 0–16)

## 2021-06-28 NOTE — Progress Notes (Signed)
HEMATOLOGY/ONCOLOGY CLINIC NOTE  Date of Service: 06/28/2021   Patient Care Team: Patient, No Pcp Per (Inactive) as PCP - General (General Practice)  CHIEF COMPLAINTS/PURPOSE OF CONSULTATION:  F/u for classical [name]   Diagnosis Classical [name] nodular sclerosis type with no type b constitutional symptoms. Stage IVA as per PET/CT scan suggestive of bone marrow involvement.  Treatment ABVD x 3 cycles -> AVD X 3 cycles Completed treatment 01/28/2017  HISTORY OF PRESENTING ILLNESS:  Plz see previous note for details.   INTERVAL HISTORY   [name] is here for scheduled 6 month f/u for his hodgkins lymphoma after completion of ABVD->AVD in March 2018.  His last visit with [place] was on 12/09/2020. The pt reports no new symptoms or concerns. He is not currently on any medications. Patient reports no fevers no chills no night sweats no unexpected weight loss no new lumps or bumps no other focal new symptoms.  He notes that he has been in good spirits and is soon to be engaged with a plan to get married next year.  Lab results today 06/22/2021 of CBC w/diff and CMP were reviewed with him and are unremarkable. On review of systems, patient denies any other acute new symptoms.  [name]   MEDICAL HISTORY:  1) Hives to pollen  SURGICAL HISTORY: Past Surgical History:  Procedure Laterality Date   IR GENERIC HISTORICAL  08/02/2016   IR FLUORO GUIDE PORT INSERTION RIGHT 08/02/2016 [name], MD [facility]   IR GENERIC HISTORICAL  08/02/2016   IR US GUIDE VASC ACCESS RIGHT 08/02/2016 [name], MD [facility]   IR REMOVAL TUN ACCESS W/ PORT W/O FL MOD SED  03/02/2017   KNEE ARTHROSCOPY     LYMPH GLAND EXCISION Left 07/20/2016   Procedure: LEFT CERVICAL LYMPH NODE BIOPSY;  Surgeon: Jackolyn Confer, MD;  Location: [place];  Service: General;  Laterality: Left;    SOCIAL HISTORY: Social History   Socioeconomic History   Marital status:  Single    Spouse name: Not on file   Number of children: Not on file   Years of education: Not on file   Highest education level: Not on file  Occupational History   Not on file  Tobacco Use   Smoking status: Never   Smokeless tobacco: Never  Vaping Use   Vaping Use: Never used  Substance and Sexual Activity   Alcohol use: No   Drug use: No   Sexual activity: Not on file  Other Topics Concern   Not on file  Social History Narrative   Not on file   Social Determinants of Health   Financial Resource Strain: Not on file  Food Insecurity: Not on file  Transportation Needs: Not on file  Physical Activity: Not on file  Stress: Not on file  Social Connections: Not on file  Intimate Partner Violence: Not on file  Never smoker   no significant alcohol use  FAMILY HISTORY: family history of hypertension  No known family history of blood disorders or cancer .  ALLERGIES:  has No Known Allergies.  Allergic to pollen no other known drug or food allergies .  MEDICATIONS:  Current Outpatient Medications  Medication Sig Dispense Refill   ibuprofen (ADVIL,MOTRIN) 200 MG tablet Take 200 mg by mouth every 6 (six) hours as needed for mild pain.     No current facility-administered medications for this visit.    REVIEW OF SYSTEMS:   10 Point review of Systems  was done is negative except as noted above.  PHYSICAL EXAMINATION:  ECOG FS:0 - Asymptomatic  Vitals:   06/22/21 1019  BP: 130/78  Pulse: (!) 43  Resp: 17  Temp: 97.6 F (36.4 C)  SpO2: 99%   Wt Readings from Last 3 Encounters:  06/22/21 283 lb 14.4 oz (128.8 kg)  12/09/20 288 lb 6.4 oz (130.8 kg)  05/21/20 279 lb 3.2 oz (126.6 kg)   Body mass index is 37.46 kg/m.   NAD . GENERAL:alert, in no acute distress and comfortable SKIN: no acute rashes, no significant lesions EYES: conjunctiva are pink and non-injected, sclera anicteric OROPHARYNX: MMM, no exudates, no oropharyngeal erythema or ulceration NECK:  supple, no JVD LYMPH:  no palpable lymphadenopathy in the cervical, axillary or inguinal regions LUNGS: clear to auscultation b/l with normal respiratory effort HEART: regular rate & rhythm ABDOMEN:  normoactive bowel sounds , non tender, not distended. Extremity: no pedal edema PSYCH: alert & oriented x 3 with fluent speech NEURO: no focal motor/sensory deficits   LABORATORY DATA:  I have reviewed the data as listed  CBC Latest Ref Rng & Units 06/22/2021 12/09/2020 05/21/2020  WBC 4.0 - 10.5 K/uL 5.3 5.5 5.7  Hemoglobin 13.0 - 17.0 g/dL 13.6 14.3 14.5  Hematocrit 39.0 - 52.0 % 41.3 43.8 44.0  Platelets 150 - 400 K/uL 193 194 191   CBC    Component Value Date/Time   WBC 5.3 06/22/2021 0948   RBC 4.62 06/22/2021 0948   HGB 13.6 06/22/2021 0948   HGB 14.2 03/13/2018 1003   HGB 14.1 08/18/2017 0840   HCT 41.3 06/22/2021 0948   HCT 43.2 08/18/2017 0840   PLT 193 06/22/2021 0948   PLT 179 03/13/2018 1003   PLT 199 08/18/2017 0840   MCV 89.4 06/22/2021 0948   MCV 87.3 08/18/2017 0840   MCH 29.4 06/22/2021 0948   MCHC 32.9 06/22/2021 0948   RDW 13.2 06/22/2021 0948   RDW 14.6 08/18/2017 0840   LYMPHSABS 2.3 06/22/2021 0948   LYMPHSABS 2.3 08/18/2017 0840   MONOABS 0.4 06/22/2021 0948   MONOABS 0.6 08/18/2017 0840   EOSABS 0.3 06/22/2021 0948   EOSABS 0.3 08/18/2017 0840   BASOSABS 0.0 06/22/2021 0948   BASOSABS 0.0 08/18/2017 0840    . CMP Latest Ref Rng & Units 06/22/2021 12/09/2020 05/21/2020  Glucose 70 - 99 mg/dL 92 86 88  BUN 6 - 20 mg/dL '12 14 14  '$ Creatinine 0.61 - 1.24 mg/dL 1.26(H) 1.24 1.45(H)  Sodium 135 - 145 mmol/L 143 137 140  Potassium 3.5 - 5.1 mmol/L 4.4 4.1 4.1  Chloride 98 - 111 mmol/L 108 106 107  CO2 22 - 32 mmol/L '27 27 25  '$ Calcium 8.9 - 10.3 mg/dL 9.3 9.1 9.6  Total Protein 6.5 - 8.1 g/dL 7.3 7.6 7.7  Total Bilirubin 0.3 - 1.2 mg/dL 0.4 0.8 0.5  Alkaline Phos 38 - 126 U/L 59 63 62  AST 15 - 41 U/L '26 21 20  '$ ALT 0 - 44 U/L '23 23 22   '$ Sed rate  0    RADIOGRAPHIC STUDIES: I have personally reviewed the radiological images as listed and agreed with the findings in the report.  No results found.  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 Stage IVA as per PET/CT scan suggestive of bone marrow involvement. Sedimentation rate 43 (on diagnosis) - Now  normalized with treatment  PET/CT post treatment completion on 02/16/2017 shows CR status with no residual disease > Deauville 1.   PLAN: -Discussed pt labwork today, 06/22/2021; blood counts and chemistries normal. Sedimentation rate is within normal limits  -Advised pt that risk of recurrent at this point is very close to the average risk of someone his age. -No lab or clinical evidence of [name] recurrence at this time.  -Recommended that the pt continue to eat well, drink at least 48-64 oz of water each day, and walk 20-30 minutes each day.  -We shall move to yearly follow-ups at this time. -Patient was recommended to stay up-to-date with his yearly flu shot subsequent COVID vaccinations and other age-appropriate vaccinations with his primary care physician. -Age-appropriate cancer screening with primary care physician.   FOLLOW UP: RTC with Dr Irene Limbo with labs in 12 months   . The total time spent in the appointment was 20 minutes and more than 50% was on counseling and direct patient cares.   All of the patient's questions were answered with apparent satisfaction. The patient knows to call the clinic with any problems, questions or concerns.  Sullivan Lone MD [place] AAHIVMS [facility]  Hematology/Oncology Physician [facility]  (Office):       [phone] (Work cell):  [phone] (Fax):           [phone]

## 2021-12-25 ENCOUNTER — Other Ambulatory Visit: Payer: Self-pay

## 2021-12-25 DIAGNOSIS — C8111 Nodular sclerosis classical Hodgkin lymphoma, lymph nodes of head, face, and neck: Secondary | ICD-10-CM

## 2021-12-28 ENCOUNTER — Other Ambulatory Visit: Payer: Self-pay

## 2021-12-28 ENCOUNTER — Inpatient Hospital Stay (HOSPITAL_BASED_OUTPATIENT_CLINIC_OR_DEPARTMENT_OTHER): Payer: 59 | Admitting: Hematology

## 2021-12-28 ENCOUNTER — Inpatient Hospital Stay: Payer: 59 | Attending: Hematology

## 2021-12-28 VITALS — BP 136/58 | HR 45 | Temp 98.1°F | Resp 17 | Ht 73.0 in | Wt 287.9 lb

## 2021-12-28 DIAGNOSIS — C8111 Nodular sclerosis classical Hodgkin lymphoma, lymph nodes of head, face, and neck: Secondary | ICD-10-CM

## 2021-12-28 DIAGNOSIS — Z8571 Personal history of Hodgkin lymphoma: Secondary | ICD-10-CM | POA: Insufficient documentation

## 2021-12-28 LAB — CBC WITH DIFFERENTIAL (CANCER CENTER ONLY)
Abs Immature Granulocytes: 0.01 10*3/uL (ref 0.00–0.07)
Basophils Absolute: 0 10*3/uL (ref 0.0–0.1)
Basophils Relative: 1 %
Eosinophils Absolute: 0.4 10*3/uL (ref 0.0–0.5)
Eosinophils Relative: 7 %
HCT: 39 % (ref 39.0–52.0)
Hemoglobin: 13.2 g/dL (ref 13.0–17.0)
Immature Granulocytes: 0 %
Lymphocytes Relative: 40 %
Lymphs Abs: 2.2 10*3/uL (ref 0.7–4.0)
MCH: 29.3 pg (ref 26.0–34.0)
MCHC: 33.8 g/dL (ref 30.0–36.0)
MCV: 86.7 fL (ref 80.0–100.0)
Monocytes Absolute: 0.5 10*3/uL (ref 0.1–1.0)
Monocytes Relative: 10 %
Neutro Abs: 2.3 10*3/uL (ref 1.7–7.7)
Neutrophils Relative %: 42 %
Platelet Count: 182 10*3/uL (ref 150–400)
RBC: 4.5 MIL/uL (ref 4.22–5.81)
RDW: 13.2 % (ref 11.5–15.5)
WBC Count: 5.5 10*3/uL (ref 4.0–10.5)
nRBC: 0 % (ref 0.0–0.2)

## 2021-12-28 LAB — CMP (CANCER CENTER ONLY)
ALT: 21 U/L (ref 0–44)
AST: 19 U/L (ref 15–41)
Albumin: 4 g/dL (ref 3.5–5.0)
Alkaline Phosphatase: 50 U/L (ref 38–126)
Anion gap: 4 — ABNORMAL LOW (ref 5–15)
BUN: 16 mg/dL (ref 6–20)
CO2: 26 mmol/L (ref 22–32)
Calcium: 9 mg/dL (ref 8.9–10.3)
Chloride: 110 mmol/L (ref 98–111)
Creatinine: 1.15 mg/dL (ref 0.61–1.24)
GFR, Estimated: >60 mL/min (ref >60)
Glucose, Bld: 92 mg/dL (ref 70–99)
Potassium: 3.9 mmol/L (ref 3.5–5.1)
Sodium: 140 mmol/L (ref 135–145)
Total Bilirubin: 0.4 mg/dL (ref 0.3–1.2)
Total Protein: 6.8 g/dL (ref 6.5–8.1)

## 2021-12-28 LAB — SEDIMENTATION RATE: Sed Rate: 1 mm/hr (ref 0–16)

## 2021-12-29 ENCOUNTER — Telehealth: Payer: Self-pay | Admitting: Hematology

## 2021-12-29 NOTE — Telephone Encounter (Signed)
Unable to leave message with follow-up appointment per 2/27 los due to voicemail not set up. Mailed calendar.

## 2022-01-03 NOTE — Progress Notes (Signed)
HEMATOLOGY/ONCOLOGY CLINIC NOTE  Date of Service 12/28/2021 Follow-up for classical [name] surveillance  Patient Care Team: Patient, No Pcp Per (Inactive) as PCP - General (General Practice)  CHIEF COMPLAINTS/PURPOSE OF CONSULTATION:  Continued follow-up for [name] surveillance  Diagnosis Classical [name] nodular sclerosis type with no type b constitutional symptoms. Stage IVA as per PET/CT scan suggestive of bone marrow involvement.  Treatment ABVD x 3 cycles -> AVD X 3 cycles Completed treatment 01/28/2017  HISTORY OF PRESENTING ILLNESS:  Plz see previous note for details.   INTERVAL HISTORY   Mr Mclear is here for continued evaluation and management of his [name]. 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 He continues to be in peak health and notes no acute new symptoms or health limitations. No fevers no chills no night sweats.  No new lumps or bumps. No new fatigue.  No new shortness of breath or chest pain.  No abdominal pain or distention. Lab results reviewed.  MEDICAL HISTORY:  1) Hives to pollen  SURGICAL HISTORY: Past Surgical History:  Procedure Laterality Date   IR GENERIC HISTORICAL  08/02/2016   IR FLUORO GUIDE PORT INSERTION RIGHT 08/02/2016 [name], MD [facility]   IR GENERIC HISTORICAL  08/02/2016   IR US GUIDE VASC ACCESS RIGHT 08/02/2016 [name], MD [facility]   IR REMOVAL TUN ACCESS W/ PORT W/O FL MOD SED  03/02/2017   KNEE ARTHROSCOPY     LYMPH GLAND EXCISION Left 07/20/2016   Procedure: LEFT CERVICAL LYMPH NODE BIOPSY;  Surgeon: Jackolyn Confer, MD;  Location: [place];  Service: General;  Laterality: Left;    SOCIAL HISTORY: Social History   Socioeconomic History   Marital status: Single    Spouse name: Not on file   Number of children: Not on file   Years of education: Not on file   Highest education level: Not on file   Occupational History   Not on file  Tobacco Use   Smoking status: Never   Smokeless tobacco: Never  Vaping Use   Vaping Use: Never used  Substance and Sexual Activity   Alcohol use: No   Drug use: No   Sexual activity: Not on file  Other Topics Concern   Not on file  Social History Narrative   Not on file   Social Determinants of Health   Financial Resource Strain: Not on file  Food Insecurity: Not on file  Transportation Needs: Not on file  Physical Activity: Not on file  Stress: Not on file  Social Connections: Not on file  Intimate Partner Violence: Not on file  Never smoker   no significant alcohol use  FAMILY HISTORY: family history of hypertension  No known family history of blood disorders or cancer .  ALLERGIES:  has No Known Allergies.  Allergic to pollen no other known drug or food allergies .  MEDICATIONS:  Current Outpatient Medications  Medication Sig Dispense Refill   ibuprofen (ADVIL,MOTRIN) 200 MG tablet Take 200 mg by mouth every 6 (six) hours as needed for mild pain.     No current facility-administered medications for this visit.    REVIEW OF SYSTEMS:   ROS 10 Point review of Systems was done is negative except as noted above.  NAD GENERAL:alert, in no acute distress and comfortable SKIN: no acute rashes, no significant lesions EYES: conjunctiva are pink and non-injected, sclera anicteric OROPHARYNX: MMM, no exudates, no oropharyngeal erythema or ulceration NECK:  supple, no JVD LYMPH:  no palpable lymphadenopathy in the cervical, axillary or inguinal regions LUNGS: clear to auscultation b/l with normal respiratory effort HEART: regular rate & rhythm ABDOMEN:  normoactive bowel sounds , non tender, not distended. Extremity: no pedal edema PSYCH: alert & oriented x 3 with fluent speech NEURO: no focal motor/sensory deficits   PHYSICAL EXAMINATION:  ECOG FS:0 - Asymptomatic  Vitals:   12/28/21 1041  BP: (!) 136/58  Pulse: (!) 45   Resp: 17  Temp: 98.1 F (36.7 C)  SpO2: 98%   Wt Readings from Last 3 Encounters:  12/28/21 287 lb 14.4 oz (130.6 kg)  06/22/21 283 lb 14.4 oz (128.8 kg)  12/09/20 288 lb 6.4 oz (130.8 kg)   Body mass index is 37.98 kg/m.   NAD . GENERAL:alert, in no acute distress and comfortable SKIN: no acute rashes, no significant lesions EYES: conjunctiva are pink and non-injected, sclera anicteric OROPHARYNX: MMM, no exudates, no oropharyngeal erythema or ulceration NECK: supple, no JVD LYMPH:  no palpable lymphadenopathy in the cervical, axillary or inguinal regions LUNGS: clear to auscultation b/l with normal respiratory effort HEART: regular rate & rhythm ABDOMEN:  normoactive bowel sounds , non tender, not distended. Extremity: no pedal edema PSYCH: alert & oriented x 3 with fluent speech NEURO: no focal motor/sensory deficits   LABORATORY DATA:  I have reviewed the data as listed  CBC Latest Ref Rng & Units 12/28/2021 06/22/2021 12/09/2020  WBC 4.0 - 10.5 K/uL 5.5 5.3 5.5  Hemoglobin 13.0 - 17.0 g/dL 13.2 13.6 14.3  Hematocrit 39.0 - 52.0 % 39.0 41.3 43.8  Platelets 150 - 400 K/uL 182 193 194   CBC    Component Value Date/Time   WBC 5.5 12/28/2021 1023   WBC 5.3 06/22/2021 0948   RBC 4.50 12/28/2021 1023   HGB 13.2 12/28/2021 1023   HGB 14.1 08/18/2017 0840   HCT 39.0 12/28/2021 1023   HCT 43.2 08/18/2017 0840   PLT 182 12/28/2021 1023   PLT 199 08/18/2017 0840   MCV 86.7 12/28/2021 1023   MCV 87.3 08/18/2017 0840   MCH 29.3 12/28/2021 1023   MCHC 33.8 12/28/2021 1023   RDW 13.2 12/28/2021 1023   RDW 14.6 08/18/2017 0840   LYMPHSABS 2.2 12/28/2021 1023   LYMPHSABS 2.3 08/18/2017 0840   MONOABS 0.5 12/28/2021 1023   MONOABS 0.6 08/18/2017 0840   EOSABS 0.4 12/28/2021 1023   EOSABS 0.3 08/18/2017 0840   BASOSABS 0.0 12/28/2021 1023   BASOSABS 0.0 08/18/2017 0840    . CMP Latest Ref Rng & Units 12/28/2021 06/22/2021 12/09/2020  Glucose 70 - 99 mg/dL 92 92 86  BUN  6 - 20 mg/dL '16 12 14  '$ Creatinine 0.61 - 1.24 mg/dL 1.15 1.26(H) 1.24  Sodium 135 - 145 mmol/L 140 143 137  Potassium 3.5 - 5.1 mmol/L 3.9 4.4 4.1  Chloride 98 - 111 mmol/L 110 108 106  CO2 22 - 32 mmol/L '26 27 27  '$ Calcium 8.9 - 10.3 mg/dL 9.0 9.3 9.1  Total Protein 6.5 - 8.1 g/dL 6.8 7.3 7.6  Total Bilirubin 0.3 - 1.2 mg/dL 0.4 0.4 0.8  Alkaline Phos 38 - 126 U/L 50 59 63  AST 15 - 41 U/L '19 26 21  '$ ALT 0 - 44 U/L '21 23 23   '$ Sed rate 1    RADIOGRAPHIC STUDIES: I have personally reviewed the radiological images as listed and agreed with the findings in the report.  No results found.  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 Stage IVA as per PET/CT scan suggestive of bone marrow involvement. Sedimentation rate 43 (on diagnosis) - Now normalized with treatment  PET/CT post treatment completion on 02/16/2017 shows CR status with no residual disease > Deauville 1.   PLAN: -Mr. Hagood has no new clinical signs or symptoms suggestive of [name] recurrence/progression at this time Labs done today show normal CBC, unremarkable CMP Sedimentation rate is at 1 which is within normal limits. -No indication for additional work-up or treatment of the patient's [name] at this time. -He shall be nearly 5 years out from his treatment in August 2023 We discussed that his recurrence risk at this point is getting to be close to average age-adjusted risk. We shall change his surveillance to every 12 months at this time. Continue age-appropriate cancer screening and vaccinations with primary care physician  FOLLOW UP: Return to clinic with Dr. Irene Limbo with labs in 12 months  All of the patient's questions were answered with apparent satisfaction. The patient knows to call the clinic with any problems, questions or concerns.  Sullivan Lone  MD [place] AAHIVMS [facility]  Hematology/Oncology Physician [facility]

## 2022-01-08 ENCOUNTER — Telehealth: Payer: Self-pay | Admitting: Hematology

## 2022-01-08 NOTE — Telephone Encounter (Signed)
Rescheduled upcoming appointment due to provider's schedule. Mailed calendar. ?

## 2022-12-23 ENCOUNTER — Other Ambulatory Visit: Payer: Self-pay

## 2022-12-23 DIAGNOSIS — C8111 Nodular sclerosis classical Hodgkin lymphoma, lymph nodes of head, face, and neck: Secondary | ICD-10-CM

## 2022-12-27 ENCOUNTER — Other Ambulatory Visit: Payer: Self-pay

## 2022-12-27 ENCOUNTER — Other Ambulatory Visit: Payer: 59

## 2022-12-27 ENCOUNTER — Inpatient Hospital Stay (HOSPITAL_BASED_OUTPATIENT_CLINIC_OR_DEPARTMENT_OTHER): Payer: 59 | Admitting: Hematology

## 2022-12-27 ENCOUNTER — Inpatient Hospital Stay: Payer: 59 | Attending: Hematology

## 2022-12-27 ENCOUNTER — Ambulatory Visit: Payer: 59 | Admitting: Hematology

## 2022-12-27 VITALS — BP 144/92 | HR 60 | Temp 98.7°F | Resp 14 | Wt 297.9 lb

## 2022-12-27 DIAGNOSIS — Z8571 Personal history of Hodgkin lymphoma: Secondary | ICD-10-CM | POA: Diagnosis not present

## 2022-12-27 DIAGNOSIS — C8111 Nodular sclerosis classical Hodgkin lymphoma, lymph nodes of head, face, and neck: Secondary | ICD-10-CM

## 2022-12-27 LAB — CMP (CANCER CENTER ONLY)
ALT: 17 U/L (ref 0–44)
AST: 22 U/L (ref 15–41)
Albumin: 4.5 g/dL (ref 3.5–5.0)
Alkaline Phosphatase: 60 U/L (ref 38–126)
Anion gap: 7 (ref 5–15)
BUN: 18 mg/dL (ref 6–20)
CO2: 27 mmol/L (ref 22–32)
Calcium: 9.2 mg/dL (ref 8.9–10.3)
Chloride: 106 mmol/L (ref 98–111)
Creatinine: 1.18 mg/dL (ref 0.61–1.24)
GFR, Estimated: >60 mL/min (ref >60)
Glucose, Bld: 78 mg/dL (ref 70–99)
Potassium: 4 mmol/L (ref 3.5–5.1)
Sodium: 140 mmol/L (ref 135–145)
Total Bilirubin: 0.4 mg/dL (ref 0.3–1.2)
Total Protein: 7.5 g/dL (ref 6.5–8.1)

## 2022-12-27 LAB — CBC WITH DIFFERENTIAL (CANCER CENTER ONLY)
Abs Immature Granulocytes: 0.01 10*3/uL (ref 0.00–0.07)
Basophils Absolute: 0 10*3/uL (ref 0.0–0.1)
Basophils Relative: 1 %
Eosinophils Absolute: 0.3 10*3/uL (ref 0.0–0.5)
Eosinophils Relative: 4 %
HCT: 42.8 % (ref 39.0–52.0)
Hemoglobin: 14.6 g/dL (ref 13.0–17.0)
Immature Granulocytes: 0 %
Lymphocytes Relative: 36 %
Lymphs Abs: 2.5 10*3/uL (ref 0.7–4.0)
MCH: 29.7 pg (ref 26.0–34.0)
MCHC: 34.1 g/dL (ref 30.0–36.0)
MCV: 87.2 fL (ref 80.0–100.0)
Monocytes Absolute: 0.7 10*3/uL (ref 0.1–1.0)
Monocytes Relative: 10 %
Neutro Abs: 3.3 10*3/uL (ref 1.7–7.7)
Neutrophils Relative %: 49 %
Platelet Count: 222 10*3/uL (ref 150–400)
RBC: 4.91 MIL/uL (ref 4.22–5.81)
RDW: 12.8 % (ref 11.5–15.5)
WBC Count: 6.9 10*3/uL (ref 4.0–10.5)
nRBC: 0 % (ref 0.0–0.2)

## 2022-12-27 LAB — SEDIMENTATION RATE: Sed Rate: 3 mm/hr (ref 0–16)

## 2022-12-27 NOTE — Progress Notes (Addendum)
HEMATOLOGY/ONCOLOGY CLINIC NOTE  Date of Service 12/27/22  Follow-up for classical [name] surveillance  Patient Care Team: Patient, No Pcp Per as PCP - General (General Practice)  CHIEF COMPLAINTS/PURPOSE OF CONSULTATION:  Continued follow-up for [name] surveillance  Diagnosis Classical [name] nodular sclerosis type with no type b constitutional symptoms. Stage IVA as per PET/CT scan suggestive of bone marrow involvement.  Treatment ABVD x 3 cycles -> AVD X 3 cycles Completed treatment 01/28/2017  HISTORY OF PRESENTING ILLNESS:  Plz see previous note for details.   INTERVAL HISTORY   Craig Schmitt is here for continued evaluation and management of his [name].  Patient was last seen by me on 12/28/2021 and he was doing well overall.   Patient reports he has been doing well overall without any new medical concerns since our last visit. He denies fever, chills, night sweats, fatigue, unexpected weight loss, new lumps/bumps, abnormal bowel, testicular pain, testicular swelling, abdominal pain, chest pain, back pain, or leg swelling.   MEDICAL HISTORY:  1) Hives to pollen  SURGICAL HISTORY: Past Surgical History:  Procedure Laterality Date   IR GENERIC HISTORICAL  08/02/2016   IR FLUORO GUIDE PORT INSERTION RIGHT 08/02/2016 [name], MD [facility]   IR GENERIC HISTORICAL  08/02/2016   IR US GUIDE VASC ACCESS RIGHT 08/02/2016 [name], MD [facility]   IR REMOVAL TUN ACCESS W/ PORT W/O FL MOD SED  03/02/2017   KNEE ARTHROSCOPY     LYMPH GLAND EXCISION Left 07/20/2016   Procedure: LEFT CERVICAL LYMPH NODE BIOPSY;  Surgeon: Jackolyn Confer, MD;  Location: [facility];  Service: General;  Laterality: Left;    SOCIAL HISTORY: Social History   Socioeconomic History   Marital status: Single    Spouse name: Not on file   Number of children: Not on file   Years of education: Not on file   Highest  education level: Not on file  Occupational History   Not on file  Tobacco Use   Smoking status: Never   Smokeless tobacco: Never  Vaping Use   Vaping Use: Never used  Substance and Sexual Activity   Alcohol use: No   Drug use: No   Sexual activity: Not on file  Other Topics Concern   Not on file  Social History Narrative   Not on file   Social Determinants of Health   Financial Resource Strain: Not on file  Food Insecurity: Not on file  Transportation Needs: Not on file  Physical Activity: Not on file  Stress: Not on file  Social Connections: Not on file  Intimate Partner Violence: Not on file  Never smoker   no significant alcohol use  FAMILY HISTORY: family history of hypertension  No known family history of blood disorders or cancer .  ALLERGIES:  has No Known Allergies.  Allergic to pollen no other known drug or food allergies .  MEDICATIONS:  Current Outpatient Medications  Medication Sig Dispense Refill   ibuprofen (ADVIL,MOTRIN) 200 MG tablet Take 200 mg by mouth every 6 (six) hours as needed for mild pain.     No current facility-administered medications for this visit.    REVIEW OF SYSTEMS:   ROS 10 Point review of Systems was done is negative except as noted above.    PHYSICAL EXAMINATION:  ECOG FS:0 - Asymptomatic  Vitals:   12/27/22 1326  BP: (!) 144/92  Pulse: 60  Resp: 14  Temp: 98.7 F (37.1 C)  SpO2: 98%    Wt Readings from Last 3 Encounters:  12/28/21 287 lb 14.4 oz (130.6 kg)  06/22/21 283 lb 14.4 oz (128.8 kg)  12/09/20 288 lb 6.4 oz (130.8 kg)   Body mass index is 39.3 kg/m.    GENERAL:alert, in no acute distress and comfortable SKIN: no acute rashes, no significant lesions EYES: conjunctiva are pink and non-injected, sclera anicteric OROPHARYNX: MMM, no exudates, no oropharyngeal erythema or ulceration NECK: supple, no JVD LYMPH:  no palpable lymphadenopathy in the cervical, axillary or inguinal regions LUNGS: clear to  auscultation b/l with normal respiratory effort HEART: regular rate & rhythm ABDOMEN:  normoactive bowel sounds , non tender, not distended. Extremity: no pedal edema PSYCH: alert & oriented x 3 with fluent speech NEURO: no focal motor/sensory deficits  LABORATORY DATA:  I have reviewed the data as listed     Latest Ref Rng & Units 12/28/2021   10:23 AM 06/22/2021    9:48 AM 12/09/2020    1:37 PM  CBC  WBC 4.0 - 10.5 K/uL 5.5  5.3  5.5   Hemoglobin 13.0 - 17.0 g/dL 13.2  13.6  14.3   Hematocrit 39.0 - 52.0 % 39.0  41.3  43.8   Platelets 150 - 400 K/uL 182  193  194    CBC    Component Value Date/Time   WBC 5.5 12/28/2021 1023   WBC 5.3 06/22/2021 0948   RBC 4.50 12/28/2021 1023   HGB 13.2 12/28/2021 1023   HGB 14.1 08/18/2017 0840   HCT 39.0 12/28/2021 1023   HCT 43.2 08/18/2017 0840   PLT 182 12/28/2021 1023   PLT 199 08/18/2017 0840   MCV 86.7 12/28/2021 1023   MCV 87.3 08/18/2017 0840   MCH 29.3 12/28/2021 1023   MCHC 33.8 12/28/2021 1023   RDW 13.2 12/28/2021 1023   RDW 14.6 08/18/2017 0840   LYMPHSABS 2.2 12/28/2021 1023   LYMPHSABS 2.3 08/18/2017 0840   MONOABS 0.5 12/28/2021 1023   MONOABS 0.6 08/18/2017 0840   EOSABS 0.4 12/28/2021 1023   EOSABS 0.3 08/18/2017 0840   BASOSABS 0.0 12/28/2021 1023   BASOSABS 0.0 08/18/2017 0840    .    Latest Ref Rng & Units 12/28/2021   10:23 AM 06/22/2021    9:48 AM 12/09/2020    1:37 PM  CMP  Glucose 70 - 99 mg/dL 92  92  86   BUN 6 - 20 mg/dL '16  12  14   '$ Creatinine 0.61 - 1.24 mg/dL 1.15  1.26  1.24   Sodium 135 - 145 mmol/L 140  143  137   Potassium 3.5 - 5.1 mmol/L 3.9  4.4  4.1   Chloride 98 - 111 mmol/L 110  108  106   CO2 22 - 32 mmol/L '26  27  27   '$ Calcium 8.9 - 10.3 mg/dL 9.0  9.3  9.1   Total Protein 6.5 - 8.1 g/dL 6.8  7.3  7.6   Total Bilirubin 0.3 - 1.2 mg/dL 0.4  0.4  0.8   Alkaline Phos 38 - 126 U/L 50  59  63   AST 15 - 41 U/L '19  26  21   '$ ALT 0 - 44 U/L '21  23  23    '$ Sed rate  1    RADIOGRAPHIC STUDIES: I have personally reviewed the radiological images as listed and agreed with the findings in the report.  No results found.  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 Stage IVA as per PET/CT scan suggestive of bone marrow involvement. Sedimentation rate 43 (on diagnosis) - Now normalized with treatment  PET/CT post treatment completion on 02/16/2017 shows CR status with no residual disease > Deauville 1.   PLAN: -Discussed lab results from today, 12/27/2022, with the patient. CBC is stable. CMP is pending. Sedimentation rate is pending.  -No new clinical signs or symptoms suggestive of [name] recurrence/progression at this time. -No indication for additional work-up or treatment of the patient's [name] at this time. -Discussed that the risk of recurrence after 6 years is tending to the general population. -Recommended to talk to PCP if his PCP can follow up with labs for hodgkin's lymphoma.  -RTC with Dr. Irene Limbo in 12 months.   FOLLOW-UP: Return to clinic with Dr. Irene Limbo with labs in 12 months  The total time spent in the appointment was 15 minutes* .  All of the patient's questions were answered with apparent satisfaction. The patient knows to call the clinic with any problems, questions or concerns.   Craig Lone MD MS AAHIVMS [facility] Hematology/Oncology Physician [facility]  .*Total Encounter Time as defined by the Centers for Medicare and Medicaid Services includes, in addition to the face-to-face time of a patient visit (documented in the note above) non-face-to-face time: obtaining and reviewing outside history, ordering and reviewing medications, tests or procedures, care coordination (communications with other health care professionals or caregivers) and documentation in  the medical record.   I, Craig Schmitt, am acting as a Education administrator for Craig Lone, MD. .I have reviewed the above documentation for accuracy and completeness, and I agree with the above. Craig Genera MD

## 2023-12-29 ENCOUNTER — Other Ambulatory Visit: Payer: Self-pay

## 2023-12-29 DIAGNOSIS — C8111 Nodular sclerosis classical Hodgkin lymphoma, lymph nodes of head, face, and neck: Secondary | ICD-10-CM

## 2023-12-29 NOTE — Progress Notes (Signed)
 HEMATOLOGY/ONCOLOGY CLINIC NOTE  Date of Service: 12/30/2023  Follow-up for classical [name] surveillance  Patient Care Team: Patient, No Pcp Per as PCP - General (General Practice)  CHIEF COMPLAINTS/PURPOSE OF CONSULTATION:  Continued follow-up for [name] surveillance  Diagnosis Classical [name] nodular sclerosis type with no type b constitutional symptoms. Stage IVA as per PET/CT scan suggestive of bone marrow involvement.  Treatment ABVD x 3 cycles -> AVD X 3 cycles Completed treatment 01/28/2017  HISTORY OF PRESENTING ILLNESS:  Plz see previous note for details.   INTERVAL HISTORY   Craig Schmitt is a 38 y.o. male here for continued evaluation and management of his [name].  Patient was last seen by me on 12/27/2022 and was doing well overall without any new medical complaints.   Today, he reports that he has been doing well over the last year with no new health concerns. Patient denies any new lumps/bumps, fever, chills, night sweats, or infection issues.   He continues to have bothersome seasonal allergies.  He continues to stay physically active.   Patient reports that he recently traveled to [place] and [place].   MEDICAL HISTORY:  1) Hives to pollen  SURGICAL HISTORY: Past Surgical History:  Procedure Laterality Date   IR GENERIC HISTORICAL  08/02/2016   IR FLUORO GUIDE PORT INSERTION RIGHT 08/02/2016 Irish Lack, MD [facility]   IR GENERIC HISTORICAL  08/02/2016   IR US GUIDE VASC ACCESS RIGHT 08/02/2016 Irish Lack, MD [facility]   IR REMOVAL TUN ACCESS W/ PORT W/O FL MOD SED  03/02/2017   KNEE ARTHROSCOPY     LYMPH GLAND EXCISION Left 07/20/2016   Procedure: LEFT CERVICAL LYMPH NODE BIOPSY;  Surgeon: Avel Peace, MD;  Location: [facility];  Service: General;  Laterality: Left;    SOCIAL HISTORY: Social History   Socioeconomic History   Marital status: Single    Spouse name: Not  on file   Number of children: Not on file   Years of education: Not on file   Highest education level: Not on file  Occupational History   Not on file  Tobacco Use   Smoking status: Never   Smokeless tobacco: Never  Vaping Use   Vaping status: Never Used  Substance and Sexual Activity   Alcohol use: No   Drug use: No   Sexual activity: Not on file  Other Topics Concern   Not on file  Social History Narrative   Not on file   Social Drivers of Health   Financial Resource Strain: Not on file  Food Insecurity: Not on file  Transportation Needs: Not on file  Physical Activity: Not on file  Stress: Not on file  Social Connections: Not on file  Intimate Partner Violence: Not on file  Never smoker   no significant alcohol use  FAMILY HISTORY: family history of hypertension  No known family history of blood disorders or cancer .  ALLERGIES:  has no known allergies.  Allergic to pollen no other known drug or food allergies .  MEDICATIONS:  Current Outpatient Medications  Medication Sig Dispense Refill   ibuprofen (ADVIL,MOTRIN) 200 MG tablet Take 200 mg by mouth every 6 (six) hours as needed for mild pain.     No current facility-administered medications for this visit.    REVIEW OF SYSTEMS:    10 Point review of Systems was done is negative except as noted above.   PHYSICAL EXAMINATION:  ECOG FS:0 - Asymptomatic  Vitals:  12/30/23 0901  BP: 132/78  Pulse: 61  Resp: 17  Temp: (!) 97.5 F (36.4 C)  SpO2: 100%     Wt Readings from Last 3 Encounters:  12/27/22 297 lb 14.4 oz (135.1 kg)  12/28/21 287 lb 14.4 oz (130.6 kg)  06/22/21 283 lb 14.4 oz (128.8 kg)   Body mass index is 38.46 kg/m.     GENERAL:alert, in no acute distress and comfortable SKIN: no acute rashes, no significant lesions EYES: conjunctiva are pink and non-injected, sclera anicteric OROPHARYNX: MMM, no exudates, no oropharyngeal erythema or ulceration NECK: supple, no JVD LYMPH:  no  palpable lymphadenopathy in the cervical, axillary or inguinal regions LUNGS: clear to auscultation b/l with normal respiratory effort HEART: regular rate & rhythm ABDOMEN:  normoactive bowel sounds , non tender, not distended. Extremity: no pedal edema PSYCH: alert & oriented x 3 with fluent speech NEURO: no focal motor/sensory deficits   LABORATORY DATA:  I have reviewed the data as listed     Latest Ref Rng & Units 12/30/2023    8:46 AM 12/27/2022    1:11 PM 12/28/2021   10:23 AM  CBC  WBC 4.0 - 10.5 K/uL 6.8  6.9  5.5   Hemoglobin 13.0 - 17.0 g/dL 62.1  30.8  65.7   Hematocrit 39.0 - 52.0 % 43.8  42.8  39.0   Platelets 150 - 400 K/uL 174  222  182    CBC    Component Value Date/Time   WBC 6.8 12/30/2023 0846   WBC 5.3 06/22/2021 0948   RBC 4.99 12/30/2023 0846   HGB 14.4 12/30/2023 0846   HGB 14.1 08/18/2017 0840   HCT 43.8 12/30/2023 0846   HCT 43.2 08/18/2017 0840   PLT 174 12/30/2023 0846   PLT 199 08/18/2017 0840   MCV 87.8 12/30/2023 0846   MCV 87.3 08/18/2017 0840   MCH 28.9 12/30/2023 0846   MCHC 32.9 12/30/2023 0846   RDW 12.7 12/30/2023 0846   RDW 14.6 08/18/2017 0840   LYMPHSABS 2.2 12/30/2023 0846   LYMPHSABS 2.3 08/18/2017 0840   MONOABS 0.6 12/30/2023 0846   MONOABS 0.6 08/18/2017 0840   EOSABS 0.5 12/30/2023 0846   EOSABS 0.3 08/18/2017 0840   BASOSABS 0.0 12/30/2023 0846   BASOSABS 0.0 08/18/2017 0840    .    Latest Ref Rng & Units 12/30/2023    8:46 AM 12/27/2022    1:11 PM 12/28/2021   10:23 AM  CMP  Glucose 70 - 99 mg/dL 89  78  92   BUN 6 - 20 mg/dL 17  18  16    Creatinine 0.61 - 1.24 mg/dL 8.46  9.62  9.52   Sodium 135 - 145 mmol/L 141  140  140   Potassium 3.5 - 5.1 mmol/L 4.3  4.0  3.9   Chloride 98 - 111 mmol/L 106  106  110   CO2 22 - 32 mmol/L 31  27  26    Calcium 8.9 - 10.3 mg/dL 9.4  9.2  9.0   Total Protein 6.5 - 8.1 g/dL 7.6  7.5  6.8   Total Bilirubin 0.0 - 1.2 mg/dL 0.5  0.4  0.4   Alkaline Phos 38 - 126 U/L 63  60  50    AST 15 - 41 U/L 24  22  19    ALT 0 - 44 U/L 23  17  21     Sed rate 1    RADIOGRAPHIC STUDIES: I have personally reviewed the radiological images  as listed and agreed with the findings in the report.  No results found.  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 Stage IVA as per PET/CT scan suggestive of bone marrow involvement. Sedimentation rate 43 (on diagnosis) - Now normalized with treatment  PET/CT post treatment completion on 02/16/2017 shows CR status with no residual disease > [place]auville 1.   PLAN:  -Patient has been in remission for 7 years since March 2018 -Discussed lab results on 12/30/2023 in detail with patient. CBC normal; showed WBC of 6.8K, hemoglobin of 14.4, and platelets of 174K. -did not feel any enlarged lymph nodes, spleen, or liver during physical examination -No new clinical signs or symptoms suggestive of [name] recurrence/progression at this time.  -discussed that his risk of recurrence of [name] is close to the general population at this time -patient is agreeable to being discharged to his PCP at this time and returning to our clinic as needed -discussed OTC treatment option to manage seasonal allergies  FOLLOW-UP: RTC with PCP RTC with Dr Candise Che as needed  The total time spent in the appointment was 20 minutes* .  All of the patient's questions were answered with apparent satisfaction. The patient knows to call the clinic with any problems, questions or concerns.   Wyvonnia Lora MD MS AAHIVMS [facility] Hematology/Oncology Physician [facility]  .*Total Encounter Time as defined by the Centers for Medicare and Medicaid Services includes, in addition to the face-to-face time of a patient visit (documented in the note above) non-face-to-face time: obtaining and reviewing outside  history, ordering and reviewing medications, tests or procedures, care coordination (communications with other health care professionals or caregivers) and documentation in the medical record.    I,Mitra Faeizi,acting as a Neurosurgeon for Wyvonnia Lora, MD.,have documented all relevant documentation on the behalf of Wyvonnia Lora, MD,as directed by  Wyvonnia Lora, MD while in the presence of Wyvonnia Lora, MD.  .I have reviewed the above documentation for accuracy and completeness, and I agree with the above. Johney Maine MD

## 2023-12-30 ENCOUNTER — Inpatient Hospital Stay: Payer: 59 | Attending: Hematology

## 2023-12-30 ENCOUNTER — Inpatient Hospital Stay: Payer: 59 | Admitting: Hematology

## 2023-12-30 VITALS — BP 132/78 | HR 61 | Temp 97.5°F | Resp 17 | Ht 73.0 in | Wt 291.5 lb

## 2023-12-30 DIAGNOSIS — C8111 Nodular sclerosis classical Hodgkin lymphoma, lymph nodes of head, face, and neck: Secondary | ICD-10-CM | POA: Diagnosis not present

## 2023-12-30 DIAGNOSIS — Z8571 Personal history of Hodgkin lymphoma: Secondary | ICD-10-CM | POA: Diagnosis present

## 2023-12-30 LAB — CMP (CANCER CENTER ONLY)
ALT: 23 U/L (ref 0–44)
AST: 24 U/L (ref 15–41)
Albumin: 4.4 g/dL (ref 3.5–5.0)
Alkaline Phosphatase: 63 U/L (ref 38–126)
Anion gap: 4 — ABNORMAL LOW (ref 5–15)
BUN: 17 mg/dL (ref 6–20)
CO2: 31 mmol/L (ref 22–32)
Calcium: 9.4 mg/dL (ref 8.9–10.3)
Chloride: 106 mmol/L (ref 98–111)
Creatinine: 1.21 mg/dL (ref 0.61–1.24)
GFR, Estimated: >60 mL/min (ref >60)
Glucose, Bld: 89 mg/dL (ref 70–99)
Potassium: 4.3 mmol/L (ref 3.5–5.1)
Sodium: 141 mmol/L (ref 135–145)
Total Bilirubin: 0.5 mg/dL (ref 0.0–1.2)
Total Protein: 7.6 g/dL (ref 6.5–8.1)

## 2023-12-30 LAB — CBC WITH DIFFERENTIAL (CANCER CENTER ONLY)
Abs Immature Granulocytes: 0.01 10*3/uL (ref 0.00–0.07)
Basophils Absolute: 0 10*3/uL (ref 0.0–0.1)
Basophils Relative: 1 %
Eosinophils Absolute: 0.5 10*3/uL (ref 0.0–0.5)
Eosinophils Relative: 8 %
HCT: 43.8 % (ref 39.0–52.0)
Hemoglobin: 14.4 g/dL (ref 13.0–17.0)
Immature Granulocytes: 0 %
Lymphocytes Relative: 32 %
Lymphs Abs: 2.2 10*3/uL (ref 0.7–4.0)
MCH: 28.9 pg (ref 26.0–34.0)
MCHC: 32.9 g/dL (ref 30.0–36.0)
MCV: 87.8 fL (ref 80.0–100.0)
Monocytes Absolute: 0.6 10*3/uL (ref 0.1–1.0)
Monocytes Relative: 9 %
Neutro Abs: 3.4 10*3/uL (ref 1.7–7.7)
Neutrophils Relative %: 50 %
Platelet Count: 174 10*3/uL (ref 150–400)
RBC: 4.99 MIL/uL (ref 4.22–5.81)
RDW: 12.7 % (ref 11.5–15.5)
WBC Count: 6.8 10*3/uL (ref 4.0–10.5)
nRBC: 0 % (ref 0.0–0.2)

## 2023-12-30 LAB — SEDIMENTATION RATE: Sed Rate: 0 mm/h (ref 0–16)

## 2024-08-27 ENCOUNTER — Other Ambulatory Visit (HOSPITAL_BASED_OUTPATIENT_CLINIC_OR_DEPARTMENT_OTHER): Payer: Self-pay | Admitting: Family Medicine

## 2024-08-27 DIAGNOSIS — Z8249 Family history of ischemic heart disease and other diseases of the circulatory system: Secondary | ICD-10-CM

## 2024-11-08 ENCOUNTER — Ambulatory Visit (HOSPITAL_BASED_OUTPATIENT_CLINIC_OR_DEPARTMENT_OTHER)
Admission: RE | Admit: 2024-11-08 | Discharge: 2024-11-08 | Disposition: A | Discharge status: Home / Self Care | Payer: Self-pay | Source: Ambulatory Visit | Attending: Family Medicine | Admitting: Family Medicine

## 2024-11-08 DIAGNOSIS — Z8249 Family history of ischemic heart disease and other diseases of the circulatory system: Secondary | ICD-10-CM
